# Patient Record
Sex: Male | Born: 1970
Health system: Southern US, Community
[De-identification: ages and names within clinical notes are randomized; demographics above are authoritative.]

## PROBLEM LIST (undated history)

## (undated) DIAGNOSIS — Z833 Family history of diabetes mellitus: Secondary | ICD-10-CM

## (undated) DIAGNOSIS — E162 Hypoglycemia, unspecified: Secondary | ICD-10-CM

## (undated) DIAGNOSIS — F988 Other specified behavioral and emotional disorders with onset usually occurring in childhood and adolescence: Secondary | ICD-10-CM

## (undated) DIAGNOSIS — B356 Tinea cruris: Secondary | ICD-10-CM

## (undated) DIAGNOSIS — L409 Psoriasis, unspecified: Secondary | ICD-10-CM

## (undated) DIAGNOSIS — E7800 Pure hypercholesterolemia, unspecified: Secondary | ICD-10-CM

## (undated) DIAGNOSIS — N529 Male erectile dysfunction, unspecified: Secondary | ICD-10-CM

## (undated) DIAGNOSIS — S39012A Strain of muscle, fascia and tendon of lower back, initial encounter: Secondary | ICD-10-CM

## (undated) DIAGNOSIS — R5381 Other malaise: Secondary | ICD-10-CM

## (undated) DIAGNOSIS — K219 Gastro-esophageal reflux disease without esophagitis: Secondary | ICD-10-CM

## (undated) DIAGNOSIS — I10 Essential (primary) hypertension: Secondary | ICD-10-CM

## (undated) HISTORY — DX: Essential (primary) hypertension: I10

## (undated) HISTORY — DX: Other malaise: R53.81

## (undated) HISTORY — DX: Male erectile dysfunction, unspecified: N52.9

## (undated) HISTORY — DX: Family history of diabetes mellitus: Z83.3

## (undated) HISTORY — DX: Strain of muscle, fascia and tendon of lower back, initial encounter: S39.012A

## (undated) HISTORY — DX: Psoriasis, unspecified: L40.9

## (undated) HISTORY — DX: Gastro-esophageal reflux disease without esophagitis: K21.9

## (undated) HISTORY — DX: Hypoglycemia, unspecified: E16.2

## (undated) HISTORY — DX: Other specified behavioral and emotional disorders with onset usually occurring in childhood and adolescence: F98.8

## (undated) HISTORY — PX: NO PAST SURGERIES: SHX2092

## (undated) HISTORY — DX: Tinea cruris: B35.6

## (undated) HISTORY — DX: Pure hypercholesterolemia, unspecified: E78.00

---

## 2015-07-07 ENCOUNTER — Ambulatory Visit (INDEPENDENT_AMBULATORY_CARE_PROVIDER_SITE_OTHER): Payer: BLUE CROSS/BLUE SHIELD | Admitting: Family Medicine

## 2015-07-07 VITALS — BP 116/70 | HR 82 | Temp 98.4°F | Resp 16 | Ht 70.0 in | Wt 197.6 lb

## 2015-07-07 DIAGNOSIS — S39012A Strain of muscle, fascia and tendon of lower back, initial encounter: Secondary | ICD-10-CM | POA: Diagnosis not present

## 2015-07-07 MED ORDER — MELOXICAM 7.5 MG PO TABS: 7.5000 mg | ORAL_TABLET | Freq: Every day | ORAL | Status: DC

## 2015-07-07 MED ORDER — CYCLOBENZAPRINE HCL 5 MG PO TABS: ORAL_TABLET | ORAL | Status: DC

## 2015-07-07 NOTE — Progress Notes (Addendum)
By signing my name below I, [name], attest that this documentation has been prepared under the direction and in the presence of [name], MD. Electonically Signed. [name], Scribe 07/07/2015 at 9:02 AM  Subjective:    Patient ID: Timothy Swanson, male    DOB: [birth year], 45 y.o.   MRN: [id number]  Chief Complaint  Patient presents with  . Back Pain    Since yesterday     HPI Timothy Swanson is a 45 y.o. male who presents to the [facility] complaining of back pain that has been constant since onset yesterday. Pain started while pt was lifting a heavy bag of mulch. Pain was sudden onset into his low back. Pt denies any radiation of pain, numbness or tingling in legs or groin, weakness, urinary or bowel incontinence. Pt tried his wife's tramadol for pain with minimal relief. Pt denies taking any ibuprofen.    There are no active problems to display for this patient.  Past Medical History  Diagnosis Date  . GERD (gastroesophageal reflux disease)   . Hypertension    No past surgical history on file. Not on File Prior to Admission medications   Medication Sig Start Date End Date Taking? Authorizing Provider  calcium-vitamin D (OSCAL WITH D) 500-200 MG-UNIT tablet Take 1 tablet by mouth.   Yes Historical Provider, MD  lisinopril-hydrochlorothiazide (PRINZIDE,ZESTORETIC) 20-12.5 MG tablet Take 1 tablet by mouth daily.   Yes Historical Provider, MD  omeprazole (PRILOSEC) 20 MG capsule Take 20 mg by mouth daily.   Yes Historical Provider, MD  potassium chloride (K-DUR,KLOR-CON) 10 MEQ tablet Take 10 mEq by mouth 2 (two) times daily.   Yes Historical Provider, MD  traMADol (ULTRAM-ER) 100 MG 24 hr tablet Take 100 mg by mouth daily.   Yes Historical Provider, MD   Social History   Social History  . Marital Status: Married    Spouse Name: N/A  . Number of Children: N/A  . Years of Education: N/A   Occupational History  . Not on file.   Social  History Main Topics  . Smoking status: Never Smoker   . Smokeless tobacco: Not on file  . Alcohol Use: Not on file  . Drug Use: Not on file  . Sexual Activity: Not on file   Other Topics Concern  . Not on file   Social History Narrative  . No narrative on file      Review of Systems  Constitutional: Negative for fever.  Musculoskeletal: Positive for back pain.  Neurological: Negative for numbness.       Objective:   Physical Exam  Constitutional: He is oriented to person, place, and time. He appears well-developed and well-nourished. No distress.  HENT:  Head: Normocephalic and atraumatic.  Eyes: Conjunctivae are normal. Pupils are equal, round, and reactive to light.  Neck: Neck supple.  Cardiovascular: Normal rate.   Pulmonary/Chest: Effort normal.  Musculoskeletal: Normal range of motion.       Lumbar back: He exhibits pain (reproduced with palpation in paralumbar region L>R) and spasm (paralumbar muscles). He exhibits no bony tenderness and no deformity.  Pt has 80 degree flexion of the low back, minimal extension, decrease in lateral flexion (right worse than left), pt has equal rotation of spine. Pt has normal heel and toe walk.   Neurological: He is alert and oriented to person, place, and time. He has normal strength. No sensory deficit. Gait normal. He displays no Babinski's sign on  the right side. He displays no Babinski's sign on the left side.  Reflex Scores:      Patellar reflexes are 2+ on the right side and 2+ on the left side.      Achilles reflexes are 2+ on the right side and 2+ on the left side. Pt has negative seated straight leg raise bilat.  Skin: Skin is warm and dry.  Psychiatric: He has a normal mood and affect. His behavior is normal.  Nursing note and vitals reviewed.    Filed Vitals:   07/07/15 0839  BP: 116/70  Pulse: 82  Temp: 98.4 F (36.9 C)  TempSrc: Oral  Resp: 16  Height:  (1.778 m)  Weight: 197 lb 9.6 oz (89.631 kg)    SpO2: 97%        Assessment & Plan:   Timothy Swanson is a 45 y.o. male Low back strain, initial encounter - Plan: meloxicam (MOBIC) 7.5 MG tablet, cyclobenzaprine (FLEXERIL) 5 MG tablet   -no red flags on hx/exam. Imaging deferred at present.   -mobic, flexeril if needed, side effects discussed. Note for work today, HEP/handout and RTC precautions given.   Meds ordered this encounter  Medications  . meloxicam (MOBIC) 7.5 MG tablet    Sig: Take 1 tablet (7.5 mg total) by mouth daily.    Dispense:  30 tablet    Refill:  0  . cyclobenzaprine (FLEXERIL) 5 MG tablet    Sig: 1 pill by mouth up to every 8 hours as needed. Start with one pill by mouth each bedtime as needed due to sedation    Dispense:  15 tablet    Refill:  0   Patient Instructions       IF you received an x-ray today, you will receive an invoice from [facility]. Please contact [facility] at [phone] with questions or concerns regarding your invoice.   IF you received labwork today, you will receive an invoice from [employer]. Please contact [facility] at [phone] with questions or concerns regarding your invoice.   Our billing staff will not be able to assist you with questions regarding bills from these companies.  You will be contacted with the lab results as soon as they are available. The fastest way to get your results is to activate your My Chart account. Instructions are located on the last page of this paperwork. If you have not heard from [place] regarding the results in 2 weeks, please contact this office.     You likely have a sprained ligament or strained muscle in the low back, which can lead to some muscle spasm as well. Try the mobic each morning (do not combine with other over the counter pain relievers), flexeril at night if needed. Tylenol is ok if needed.   Heat or ice to area as needed and the other treatments and exercises in the back care manual  as tolerated. If not improving in next week or two, or worsening sooner - return for recheck.   Return to the clinic or go to the nearest emergency room if any of your symptoms worsen or new symptoms occur.   Low Back Strain With Rehab A strain is an injury in which a tendon or muscle is torn. The muscles and tendons of the lower back are vulnerable to strains. However, these muscles and tendons are very strong and require a great force to be injured. Strains are classified into three categories. Grade 1 strains cause pain,  but the tendon is not lengthened. Grade 2 strains include a lengthened ligament, due to the ligament being stretched or partially ruptured. With grade 2 strains there is still function, although the function may be decreased. Grade 3 strains involve a complete tear of the tendon or muscle, and function is usually impaired. SYMPTOMS   Pain in the lower back.  Pain that affects one side more than the other.  Pain that gets worse with movement and may be felt in the hip, buttocks, or back of the thigh.  Muscle spasms of the muscles in the back.  Swelling along the muscles of the back.  Loss of strength of the back muscles.  Crackling sound (crepitation) when the muscles are touched. CAUSES  Lower back strains occur when a force is placed on the muscles or tendons that is greater than they can handle. Common causes of injury include:  Prolonged overuse of the muscle-tendon units in the lower back, usually from incorrect posture.  A single violent injury or force applied to the back. RISK INCREASES WITH:  Sports that involve twisting forces on the spine or a lot of bending at the waist (football, rugby, weightlifting, bowling, golf, tennis, speed skating, racquetball, swimming, running, gymnastics, diving).  Poor strength and flexibility.  Failure to warm up properly before activity.  Family history of lower back pain or disk disorders.  Previous back injury or  surgery (especially fusion).  Poor posture with lifting, especially heavy objects.  Prolonged sitting, especially with poor posture. PREVENTION   Learn and use proper posture when sitting or lifting (maintain proper posture when sitting, lift using the knees and legs, not at the waist).  Warm up and stretch properly before activity.  Allow for adequate recovery between workouts.  Maintain physical fitness:  Strength, flexibility, and endurance.  Cardiovascular fitness. PROGNOSIS  If treated properly, lower back strains usually heal within 6 weeks. RELATED COMPLICATIONS   Recurring symptoms, resulting in a chronic problem.  Chronic inflammation, scarring, and partial muscle-tendon tear.  Delayed healing or resolution of symptoms.  Prolonged disability. TREATMENT  Treatment first involves the use of ice and medicine, to reduce pain and inflammation. The use of strengthening and stretching exercises may help reduce pain with activity. These exercises may be performed at home or with a therapist. Severe injuries may require referral to a therapist for further evaluation and treatment, such as ultrasound. Your caregiver may advise that you wear a back brace or corset, to help reduce pain and discomfort. Often, prolonged bed rest results in greater harm then benefit. Corticosteroid injections may be recommended. However, these should be reserved for the most serious cases. It is important to avoid using your back when lifting objects. At night, sleep on your back on a firm mattress with a pillow placed under your knees. If non-surgical treatment is unsuccessful, surgery may be needed.  MEDICATION   If pain medicine is needed, nonsteroidal anti-inflammatory medicines (aspirin and ibuprofen), or other minor pain relievers (acetaminophen), are often advised.  Do not take pain medicine for 7 days before surgery.  Prescription pain relievers may be given, if your caregiver thinks they are  needed. Use only as directed and only as much as you need.  Ointments applied to the skin may be helpful.  Corticosteroid injections may be given by your caregiver. These injections should be reserved for the most serious cases, because they may only be given a certain number of times. HEAT AND COLD  Cold treatment (icing) should be  applied for 10 to 15 minutes every 2 to 3 hours for inflammation and pain, and immediately after activity that aggravates your symptoms. Use ice packs or an ice massage.  Heat treatment may be used before performing stretching and strengthening activities prescribed by your caregiver, physical therapist, or athletic trainer. Use a heat pack or a warm water soak. SEEK MEDICAL CARE IF:   Symptoms get worse or do not improve in 2 to 4 weeks, despite treatment.  You develop numbness, weakness, or loss of bowel or bladder function.  New, unexplained symptoms develop. (Drugs used in treatment may produce side effects.) EXERCISES  RANGE OF MOTION (ROM) AND STRETCHING EXERCISES - Low Back Strain Most people with lower back pain will find that their symptoms get worse with excessive bending forward (flexion) or arching at the lower back (extension). The exercises which will help resolve your symptoms will focus on the opposite motion.  Your physician, physical therapist or athletic trainer will help you determine which exercises will be most helpful to resolve your lower back pain. Do not complete any exercises without first consulting with your caregiver. Discontinue any exercises which make your symptoms worse until you speak to your caregiver.  If you have pain, numbness or tingling which travels down into your buttocks, leg or foot, the goal of the therapy is for these symptoms to move closer to your back and eventually resolve. Sometimes, these leg symptoms will get better, but your lower back pain may worsen. This is typically an indication of progress in your  rehabilitation. Be very alert to any changes in your symptoms and the activities in which you participated in the 24 hours prior to the change. Sharing this information with your caregiver will allow him/her to most efficiently treat your condition.  These exercises may help you when beginning to rehabilitate your injury. Your symptoms may resolve with or without further involvement from your physician, physical therapist or athletic trainer. While completing these exercises, remember:  Restoring tissue flexibility helps normal motion to return to the joints. This allows healthier, less painful movement and activity.  An effective stretch should be held for at least 30 seconds.  A stretch should never be painful. You should only feel a gentle lengthening or release in the stretched tissue. FLEXION RANGE OF MOTION AND STRETCHING EXERCISES: STRETCH - Flexion, Single Knee to Chest   Lie on a firm bed or floor with both legs extended in front of you.  Keeping one leg in contact with the floor, bring your opposite knee to your chest. Hold your leg in place by either grabbing behind your thigh or at your knee.  Pull until you feel a gentle stretch in your lower back. Hold __________ seconds.  Slowly release your grasp and repeat the exercise with the opposite side. Repeat __________ times. Complete this exercise __________ times per day.  STRETCH - Flexion, Double Knee to Chest   Lie on a firm bed or floor with both legs extended in front of you.  Keeping one leg in contact with the floor, bring your opposite knee to your chest.  Tense your stomach muscles to support your back and then lift your other knee to your chest. Hold your legs in place by either grabbing behind your thighs or at your knees.  Pull both knees toward your chest until you feel a gentle stretch in your lower back. Hold __________ seconds.  Tense your stomach muscles and slowly return one leg at a time to the  floor. Repeat __________ times. Complete this exercise __________ times per day.  STRETCH - Low Trunk Rotation  Lie on a firm bed or floor. Keeping your legs in front of you, bend your knees so they are both pointed toward the ceiling and your feet are flat on the floor.  Extend your arms out to the side. This will stabilize your upper body by keeping your shoulders in contact with the floor.  Gently and slowly drop both knees together to one side until you feel a gentle stretch in your lower back. Hold for __________ seconds.  Tense your stomach muscles to support your lower back as you bring your knees back to the starting position. Repeat the exercise to the other side. Repeat __________ times. Complete this exercise __________ times per day  EXTENSION RANGE OF MOTION AND FLEXIBILITY EXERCISES: STRETCH - Extension, Prone on Elbows   Lie on your stomach on the floor, a bed will be too soft. Place your palms about shoulder width apart and at the height of your head.  Place your elbows under your shoulders. If this is too painful, stack pillows under your chest.  Allow your body to relax so that your hips drop lower and make contact more completely with the floor.  Hold this position for __________ seconds.  Slowly return to lying flat on the floor. Repeat __________ times. Complete this exercise __________ times per day.  RANGE OF MOTION - Extension, Prone Press Ups  Lie on your stomach on the floor, a bed will be too soft. Place your palms about shoulder width apart and at the height of your head.  Keeping your back as relaxed as possible, slowly straighten your elbows while keeping your hips on the floor. You may adjust the placement of your hands to maximize your comfort. As you gain motion, your hands will come more underneath your shoulders.  Hold this position __________ seconds.  Slowly return to lying flat on the floor. Repeat __________ times. Complete this exercise  __________ times per day.  RANGE OF MOTION- Quadruped, Neutral Spine   Assume a hands and knees position on a firm surface. Keep your hands under your shoulders and your knees under your hips. You may place padding under your knees for comfort.  Drop your head and point your tail bone toward the ground below you. This will round out your lower back like an angry cat. Hold this position for __________ seconds.  Slowly lift your head and release your tail bone so that your back sags into a large arch, like an old horse.  Hold this position for __________ seconds.  Repeat this until you feel limber in your lower back.  Now, find your "sweet spot." This will be the most comfortable position somewhere between the two previous positions. This is your neutral spine. Once you have found this position, tense your stomach muscles to support your lower back.  Hold this position for __________ seconds. Repeat __________ times. Complete this exercise __________ times per day.  STRENGTHENING EXERCISES - Low Back Strain These exercises may help you when beginning to rehabilitate your injury. These exercises should be done near your "sweet spot." This is the neutral, low-back arch, somewhere between fully rounded and fully arched, that is your least painful position. When performed in this safe range of motion, these exercises can be used for people who have either a flexion or extension based injury. These exercises may resolve your symptoms with or without further involvement from your physician,  physical therapist or athletic trainer. While completing these exercises, remember:   Muscles can gain both the endurance and the strength needed for everyday activities through controlled exercises.  Complete these exercises as instructed by your physician, physical therapist or athletic trainer. Increase the resistance and repetitions only as guided.  You may experience muscle soreness or fatigue, but the pain  or discomfort you are trying to eliminate should never worsen during these exercises. If this pain does worsen, stop and make certain you are following the directions exactly. If the pain is still present after adjustments, discontinue the exercise until you can discuss the trouble with your caregiver. STRENGTHENING - Deep Abdominals, Pelvic Tilt  Lie on a firm bed or floor. Keeping your legs in front of you, bend your knees so they are both pointed toward the ceiling and your feet are flat on the floor.  Tense your lower abdominal muscles to press your lower back into the floor. This motion will rotate your pelvis so that your tail bone is scooping upwards rather than pointing at your feet or into the floor.  With a gentle tension and even breathing, hold this position for __________ seconds. Repeat __________ times. Complete this exercise __________ times per day.  STRENGTHENING - Abdominals, Crunches   Lie on a firm bed or floor. Keeping your legs in front of you, bend your knees so they are both pointed toward the ceiling and your feet are flat on the floor. Cross your arms over your chest.  Slightly tip your chin down without bending your neck.  Tense your abdominals and slowly lift your trunk high enough to just clear your shoulder blades. Lifting higher can put excessive stress on the lower back and does not further strengthen your abdominal muscles.  Control your return to the starting position. Repeat __________ times. Complete this exercise __________ times per day.  STRENGTHENING - Quadruped, Opposite UE/LE Lift   Assume a hands and knees position on a firm surface. Keep your hands under your shoulders and your knees under your hips. You may place padding under your knees for comfort.  Find your neutral spine and gently tense your abdominal muscles so that you can maintain this position. Your shoulders and hips should form a rectangle that is parallel with the floor and is not  twisted.  Keeping your trunk steady, lift your right hand no higher than your shoulder and then your left leg no higher than your hip. Make sure you are not holding your breath. Hold this position __________ seconds.  Continuing to keep your abdominal muscles tense and your back steady, slowly return to your starting position. Repeat with the opposite arm and leg. Repeat __________ times. Complete this exercise __________ times per day.  STRENGTHENING - Lower Abdominals, Double Knee Lift  Lie on a firm bed or floor. Keeping your legs in front of you, bend your knees so they are both pointed toward the ceiling and your feet are flat on the floor.  Tense your abdominal muscles to brace your lower back and slowly lift both of your knees until they come over your hips. Be certain not to hold your breath.  Hold __________ seconds. Using your abdominal muscles, return to the starting position in a slow and controlled manner. Repeat __________ times. Complete this exercise __________ times per day.  POSTURE AND BODY MECHANICS CONSIDERATIONS - Low Back Strain Keeping correct posture when sitting, standing or completing your activities will reduce the stress put on different body tissues,  allowing injured tissues a chance to heal and limiting painful experiences. The following are general guidelines for improved posture. Your physician or physical therapist will provide you with any instructions specific to your needs. While reading these guidelines, remember:  The exercises prescribed by your provider will help you have the flexibility and strength to maintain correct postures.  The correct posture provides the best environment for your joints to work. All of your joints have less wear and tear when properly supported by a spine with good posture. This means you will experience a healthier, less painful body.  Correct posture must be practiced with all of your activities, especially prolonged sitting  and standing. Correct posture is as important when doing repetitive low-stress activities (typing) as it is when doing a single heavy-load activity (lifting). RESTING POSITIONS Consider which positions are most painful for you when choosing a resting position. If you have pain with flexion-based activities (sitting, bending, stooping, squatting), choose a position that allows you to rest in a less flexed posture. You would want to avoid curling into a fetal position on your side. If your pain worsens with extension-based activities (prolonged standing, working overhead), avoid resting in an extended position such as sleeping on your stomach. Most people will find more comfort when they rest with their spine in a more neutral position, neither too rounded nor too arched. Lying on a non-sagging bed on your side with a pillow between your knees, or on your back with a pillow under your knees will often provide some relief. Keep in mind, being in any one position for a prolonged period of time, no matter how correct your posture, can still lead to stiffness. PROPER SITTING POSTURE In order to minimize stress and discomfort on your spine, you must sit with correct posture. Sitting with good posture should be effortless for a healthy body. Returning to good posture is a gradual process. Many people can work toward this most comfortably by using various supports until they have the flexibility and strength to maintain this posture on their own. When sitting with proper posture, your ears will fall over your shoulders and your shoulders will fall over your hips. You should use the back of the chair to support your upper back. Your lower back will be in a neutral position, just slightly arched. You may place a small pillow or folded towel at the base of your lower back for support.  When working at a desk, create an environment that supports good, upright posture. Without extra support, muscles tire, which leads to  excessive strain on joints and other tissues. Keep these recommendations in mind: CHAIR:  A chair should be able to slide under your desk when your back makes contact with the back of the chair. This allows you to work closely.  The chair's height should allow your eyes to be level with the upper part of your monitor and your hands to be slightly lower than your elbows. BODY POSITION  Your feet should make contact with the floor. If this is not possible, use a foot rest.  Keep your ears over your shoulders. This will reduce stress on your neck and lower back. INCORRECT SITTING POSTURES  If you are feeling tired and unable to assume a healthy sitting posture, do not slouch or slump. This puts excessive strain on your back tissues, causing more damage and pain. Healthier options include:  Using more support, like a lumbar pillow.  Switching tasks to something that requires you to  be upright or walking.  Talking a brief walk.  Lying down to rest in a neutral-spine position. PROLONGED STANDING WHILE SLIGHTLY LEANING FORWARD  When completing a task that requires you to lean forward while standing in one place for a long time, place either foot up on a stationary 2-4 inch high object to help maintain the best posture. When both feet are on the ground, the lower back tends to lose its slight inward curve. If this curve flattens (or becomes too large), then the back and your other joints will experience too much stress, tire more quickly, and can cause pain. CORRECT STANDING POSTURES Proper standing posture should be assumed with all daily activities, even if they only take a few moments, like when brushing your teeth. As in sitting, your ears should fall over your shoulders and your shoulders should fall over your hips. You should keep a slight tension in your abdominal muscles to brace your spine. Your tailbone should point down to the ground, not behind your body, resulting in an over-extended  swayback posture.  INCORRECT STANDING POSTURES  Common incorrect standing postures include a forward head, locked knees and/or an excessive swayback. WALKING Walk with an upright posture. Your ears, shoulders and hips should all line-up. PROLONGED ACTIVITY IN A FLEXED POSITION When completing a task that requires you to bend forward at your waist or lean over a low surface, try to find a way to stabilize 3 out of 4 of your limbs. You can place a hand or elbow on your thigh or rest a knee on the surface you are reaching across. This will provide you more stability so that your muscles do not fatigue as quickly. By keeping your knees relaxed, or slightly bent, you will also reduce stress across your lower back. CORRECT LIFTING TECHNIQUES DO :   Assume a wide stance. This will provide you more stability and the opportunity to get as close as possible to the object which you are lifting.  Tense your abdominals to brace your spine. Bend at the knees and hips. Keeping your back locked in a neutral-spine position, lift using your leg muscles. Lift with your legs, keeping your back straight.  Test the weight of unknown objects before attempting to lift them.  Try to keep your elbows locked down at your sides in order get the best strength from your shoulders when carrying an object.  Always ask for help when lifting heavy or awkward objects. INCORRECT LIFTING TECHNIQUES DO NOT:   Lock your knees when lifting, even if it is a small object.  Bend and twist. Pivot at your feet or move your feet when needing to change directions.  Assume that you can safely pick up even a paper clip without proper posture.   This information is not intended to replace advice given to you by your health care provider. Make sure you discuss any questions you have with your health care provider.   Document Released: 01/24/2005 Document Revised: 02/14/2014 Document Reviewed: 05/08/2008 [employer] Interactive Patient  Education [employer]       I personally performed the services described in this documentation, which was scribed in my presence. The recorded information has been reviewed and considered, and addended by me as needed.  Signed,   Meredith Staggers, MD [facility].  07/07/2015 12:20 PM

## 2015-07-07 NOTE — Patient Instructions (Addendum)
IF you received an x-ray today, you will receive an invoice from [facility]. Please contact [facility] at [phone] with questions or concerns regarding your invoice.   IF you received labwork today, you will receive an invoice from [employer]. Please contact [facility] at [phone] with questions or concerns regarding your invoice.   Our billing staff will not be able to assist you with questions regarding bills from these companies.  You will be contacted with the lab results as soon as they are available. The fastest way to get your results is to activate your My Chart account. Instructions are located on the last page of this paperwork. If you have not heard from [place] regarding the results in 2 weeks, please contact this office.     You likely have a sprained ligament or strained muscle in the low back, which can lead to some muscle spasm as well. Try the mobic each morning (do not combine with other over the counter pain relievers), flexeril at night if needed. Tylenol is ok if needed.   Heat or ice to area as needed and the other treatments and exercises in the back care manual as tolerated. If not improving in next week or two, or worsening sooner - return for recheck.   Return to the clinic or go to the nearest emergency room if any of your symptoms worsen or new symptoms occur.   Low Back Strain With Rehab A strain is an injury in which a tendon or muscle is torn. The muscles and tendons of the lower back are vulnerable to strains. However, these muscles and tendons are very strong and require a great force to be injured. Strains are classified into three categories. Grade 1 strains cause pain, but the tendon is not lengthened. Grade 2 strains include a lengthened ligament, due to the ligament being stretched or partially ruptured. With grade 2 strains there is still function, although the function may be decreased. Grade 3 strains  involve a complete tear of the tendon or muscle, and function is usually impaired. SYMPTOMS   Pain in the lower back.  Pain that affects one side more than the other.  Pain that gets worse with movement and may be felt in the hip, buttocks, or back of the thigh.  Muscle spasms of the muscles in the back.  Swelling along the muscles of the back.  Loss of strength of the back muscles.  Crackling sound (crepitation) when the muscles are touched. CAUSES  Lower back strains occur when a force is placed on the muscles or tendons that is greater than they can handle. Common causes of injury include:  Prolonged overuse of the muscle-tendon units in the lower back, usually from incorrect posture.  A single violent injury or force applied to the back. RISK INCREASES WITH:  Sports that involve twisting forces on the spine or a lot of bending at the waist (football, rugby, weightlifting, bowling, golf, tennis, speed skating, racquetball, swimming, running, gymnastics, diving).  Poor strength and flexibility.  Failure to warm up properly before activity.  Family history of lower back pain or disk disorders.  Previous back injury or surgery (especially fusion).  Poor posture with lifting, especially heavy objects.  Prolonged sitting, especially with poor posture. PREVENTION   Learn and use proper posture when sitting or lifting (maintain proper posture when sitting, lift using the knees and legs, not at the waist).  Warm up and stretch properly before activity.  Allow  for adequate recovery between workouts.  Maintain physical fitness:  Strength, flexibility, and endurance.  Cardiovascular fitness. PROGNOSIS  If treated properly, lower back strains usually heal within 6 weeks. RELATED COMPLICATIONS   Recurring symptoms, resulting in a chronic problem.  Chronic inflammation, scarring, and partial muscle-tendon tear.  Delayed healing or resolution of symptoms.  Prolonged  disability. TREATMENT  Treatment first involves the use of ice and medicine, to reduce pain and inflammation. The use of strengthening and stretching exercises may help reduce pain with activity. These exercises may be performed at home or with a therapist. Severe injuries may require referral to a therapist for further evaluation and treatment, such as ultrasound. Your caregiver may advise that you wear a back brace or corset, to help reduce pain and discomfort. Often, prolonged bed rest results in greater harm then benefit. Corticosteroid injections may be recommended. However, these should be reserved for the most serious cases. It is important to avoid using your back when lifting objects. At night, sleep on your back on a firm mattress with a pillow placed under your knees. If non-surgical treatment is unsuccessful, surgery may be needed.  MEDICATION   If pain medicine is needed, nonsteroidal anti-inflammatory medicines (aspirin and ibuprofen), or other minor pain relievers (acetaminophen), are often advised.  Do not take pain medicine for 7 days before surgery.  Prescription pain relievers may be given, if your caregiver thinks they are needed. Use only as directed and only as much as you need.  Ointments applied to the skin may be helpful.  Corticosteroid injections may be given by your caregiver. These injections should be reserved for the most serious cases, because they may only be given a certain number of times. HEAT AND COLD  Cold treatment (icing) should be applied for 10 to 15 minutes every 2 to 3 hours for inflammation and pain, and immediately after activity that aggravates your symptoms. Use ice packs or an ice massage.  Heat treatment may be used before performing stretching and strengthening activities prescribed by your caregiver, physical therapist, or athletic trainer. Use a heat pack or a warm water soak. SEEK MEDICAL CARE IF:   Symptoms get worse or do not improve in 2  to 4 weeks, despite treatment.  You develop numbness, weakness, or loss of bowel or bladder function.  New, unexplained symptoms develop. (Drugs used in treatment may produce side effects.) EXERCISES  RANGE OF MOTION (ROM) AND STRETCHING EXERCISES - Low Back Strain Most people with lower back pain will find that their symptoms get worse with excessive bending forward (flexion) or arching at the lower back (extension). The exercises which will help resolve your symptoms will focus on the opposite motion.  Your physician, physical therapist or athletic trainer will help you determine which exercises will be most helpful to resolve your lower back pain. Do not complete any exercises without first consulting with your caregiver. Discontinue any exercises which make your symptoms worse until you speak to your caregiver.  If you have pain, numbness or tingling which travels down into your buttocks, leg or foot, the goal of the therapy is for these symptoms to move closer to your back and eventually resolve. Sometimes, these leg symptoms will get better, but your lower back pain may worsen. This is typically an indication of progress in your rehabilitation. Be very alert to any changes in your symptoms and the activities in which you participated in the 24 hours prior to the change. Sharing this information with your caregiver  will allow him/her to most efficiently treat your condition.  These exercises may help you when beginning to rehabilitate your injury. Your symptoms may resolve with or without further involvement from your physician, physical therapist or athletic trainer. While completing these exercises, remember:  Restoring tissue flexibility helps normal motion to return to the joints. This allows healthier, less painful movement and activity.  An effective stretch should be held for at least 30 seconds.  A stretch should never be painful. You should only feel a gentle lengthening or release in  the stretched tissue. FLEXION RANGE OF MOTION AND STRETCHING EXERCISES: STRETCH - Flexion, Single Knee to Chest   Lie on a firm bed or floor with both legs extended in front of you.  Keeping one leg in contact with the floor, bring your opposite knee to your chest. Hold your leg in place by either grabbing behind your thigh or at your knee.  Pull until you feel a gentle stretch in your lower back. Hold __________ seconds.  Slowly release your grasp and repeat the exercise with the opposite side. Repeat __________ times. Complete this exercise __________ times per day.  STRETCH - Flexion, Double Knee to Chest   Lie on a firm bed or floor with both legs extended in front of you.  Keeping one leg in contact with the floor, bring your opposite knee to your chest.  Tense your stomach muscles to support your back and then lift your other knee to your chest. Hold your legs in place by either grabbing behind your thighs or at your knees.  Pull both knees toward your chest until you feel a gentle stretch in your lower back. Hold __________ seconds.  Tense your stomach muscles and slowly return one leg at a time to the floor. Repeat __________ times. Complete this exercise __________ times per day.  STRETCH - Low Trunk Rotation  Lie on a firm bed or floor. Keeping your legs in front of you, bend your knees so they are both pointed toward the ceiling and your feet are flat on the floor.  Extend your arms out to the side. This will stabilize your upper body by keeping your shoulders in contact with the floor.  Gently and slowly drop both knees together to one side until you feel a gentle stretch in your lower back. Hold for __________ seconds.  Tense your stomach muscles to support your lower back as you bring your knees back to the starting position. Repeat the exercise to the other side. Repeat __________ times. Complete this exercise __________ times per day  EXTENSION RANGE OF MOTION AND  FLEXIBILITY EXERCISES: STRETCH - Extension, Prone on Elbows   Lie on your stomach on the floor, a bed will be too soft. Place your palms about shoulder width apart and at the height of your head.  Place your elbows under your shoulders. If this is too painful, stack pillows under your chest.  Allow your body to relax so that your hips drop lower and make contact more completely with the floor.  Hold this position for __________ seconds.  Slowly return to lying flat on the floor. Repeat __________ times. Complete this exercise __________ times per day.  RANGE OF MOTION - Extension, Prone Press Ups  Lie on your stomach on the floor, a bed will be too soft. Place your palms about shoulder width apart and at the height of your head.  Keeping your back as relaxed as possible, slowly straighten your elbows while keeping your  hips on the floor. You may adjust the placement of your hands to maximize your comfort. As you gain motion, your hands will come more underneath your shoulders.  Hold this position __________ seconds.  Slowly return to lying flat on the floor. Repeat __________ times. Complete this exercise __________ times per day.  RANGE OF MOTION- Quadruped, Neutral Spine   Assume a hands and knees position on a firm surface. Keep your hands under your shoulders and your knees under your hips. You may place padding under your knees for comfort.  Drop your head and point your tail bone toward the ground below you. This will round out your lower back like an angry cat. Hold this position for __________ seconds.  Slowly lift your head and release your tail bone so that your back sags into a large arch, like an old horse.  Hold this position for __________ seconds.  Repeat this until you feel limber in your lower back.  Now, find your "sweet spot." This will be the most comfortable position somewhere between the two previous positions. This is your neutral spine. Once you have found  this position, tense your stomach muscles to support your lower back.  Hold this position for __________ seconds. Repeat __________ times. Complete this exercise __________ times per day.  STRENGTHENING EXERCISES - Low Back Strain These exercises may help you when beginning to rehabilitate your injury. These exercises should be done near your "sweet spot." This is the neutral, low-back arch, somewhere between fully rounded and fully arched, that is your least painful position. When performed in this safe range of motion, these exercises can be used for people who have either a flexion or extension based injury. These exercises may resolve your symptoms with or without further involvement from your physician, physical therapist or athletic trainer. While completing these exercises, remember:   Muscles can gain both the endurance and the strength needed for everyday activities through controlled exercises.  Complete these exercises as instructed by your physician, physical therapist or athletic trainer. Increase the resistance and repetitions only as guided.  You may experience muscle soreness or fatigue, but the pain or discomfort you are trying to eliminate should never worsen during these exercises. If this pain does worsen, stop and make certain you are following the directions exactly. If the pain is still present after adjustments, discontinue the exercise until you can discuss the trouble with your caregiver. STRENGTHENING - Deep Abdominals, Pelvic Tilt  Lie on a firm bed or floor. Keeping your legs in front of you, bend your knees so they are both pointed toward the ceiling and your feet are flat on the floor.  Tense your lower abdominal muscles to press your lower back into the floor. This motion will rotate your pelvis so that your tail bone is scooping upwards rather than pointing at your feet or into the floor.  With a gentle tension and even breathing, hold this position for __________  seconds. Repeat __________ times. Complete this exercise __________ times per day.  STRENGTHENING - Abdominals, Crunches   Lie on a firm bed or floor. Keeping your legs in front of you, bend your knees so they are both pointed toward the ceiling and your feet are flat on the floor. Cross your arms over your chest.  Slightly tip your chin down without bending your neck.  Tense your abdominals and slowly lift your trunk high enough to just clear your shoulder blades. Lifting higher can put excessive stress on the lower  back and does not further strengthen your abdominal muscles.  Control your return to the starting position. Repeat __________ times. Complete this exercise __________ times per day.  STRENGTHENING - Quadruped, Opposite UE/LE Lift   Assume a hands and knees position on a firm surface. Keep your hands under your shoulders and your knees under your hips. You may place padding under your knees for comfort.  Find your neutral spine and gently tense your abdominal muscles so that you can maintain this position. Your shoulders and hips should form a rectangle that is parallel with the floor and is not twisted.  Keeping your trunk steady, lift your right hand no higher than your shoulder and then your left leg no higher than your hip. Make sure you are not holding your breath. Hold this position __________ seconds.  Continuing to keep your abdominal muscles tense and your back steady, slowly return to your starting position. Repeat with the opposite arm and leg. Repeat __________ times. Complete this exercise __________ times per day.  STRENGTHENING - Lower Abdominals, Double Knee Lift  Lie on a firm bed or floor. Keeping your legs in front of you, bend your knees so they are both pointed toward the ceiling and your feet are flat on the floor.  Tense your abdominal muscles to brace your lower back and slowly lift both of your knees until they come over your hips. Be certain not to hold  your breath.  Hold __________ seconds. Using your abdominal muscles, return to the starting position in a slow and controlled manner. Repeat __________ times. Complete this exercise __________ times per day.  POSTURE AND BODY MECHANICS CONSIDERATIONS - Low Back Strain Keeping correct posture when sitting, standing or completing your activities will reduce the stress put on different body tissues, allowing injured tissues a chance to heal and limiting painful experiences. The following are general guidelines for improved posture. Your physician or physical therapist will provide you with any instructions specific to your needs. While reading these guidelines, remember:  The exercises prescribed by your provider will help you have the flexibility and strength to maintain correct postures.  The correct posture provides the best environment for your joints to work. All of your joints have less wear and tear when properly supported by a spine with good posture. This means you will experience a healthier, less painful body.  Correct posture must be practiced with all of your activities, especially prolonged sitting and standing. Correct posture is as important when doing repetitive low-stress activities (typing) as it is when doing a single heavy-load activity (lifting). RESTING POSITIONS Consider which positions are most painful for you when choosing a resting position. If you have pain with flexion-based activities (sitting, bending, stooping, squatting), choose a position that allows you to rest in a less flexed posture. You would want to avoid curling into a fetal position on your side. If your pain worsens with extension-based activities (prolonged standing, working overhead), avoid resting in an extended position such as sleeping on your stomach. Most people will find more comfort when they rest with their spine in a more neutral position, neither too rounded nor too arched. Lying on a non-sagging bed  on your side with a pillow between your knees, or on your back with a pillow under your knees will often provide some relief. Keep in mind, being in any one position for a prolonged period of time, no matter how correct your posture, can still lead to stiffness. PROPER SITTING POSTURE In order  to minimize stress and discomfort on your spine, you must sit with correct posture. Sitting with good posture should be effortless for a healthy body. Returning to good posture is a gradual process. Many people can work toward this most comfortably by using various supports until they have the flexibility and strength to maintain this posture on their own. When sitting with proper posture, your ears will fall over your shoulders and your shoulders will fall over your hips. You should use the back of the chair to support your upper back. Your lower back will be in a neutral position, just slightly arched. You may place a small pillow or folded towel at the base of your lower back for support.  When working at a desk, create an environment that supports good, upright posture. Without extra support, muscles tire, which leads to excessive strain on joints and other tissues. Keep these recommendations in mind: CHAIR:  A chair should be able to slide under your desk when your back makes contact with the back of the chair. This allows you to work closely.  The chair's height should allow your eyes to be level with the upper part of your monitor and your hands to be slightly lower than your elbows. BODY POSITION  Your feet should make contact with the floor. If this is not possible, use a foot rest.  Keep your ears over your shoulders. This will reduce stress on your neck and lower back. INCORRECT SITTING POSTURES  If you are feeling tired and unable to assume a healthy sitting posture, do not slouch or slump. This puts excessive strain on your back tissues, causing more damage and pain. Healthier options  include:  Using more support, like a lumbar pillow.  Switching tasks to something that requires you to be upright or walking.  Talking a brief walk.  Lying down to rest in a neutral-spine position. PROLONGED STANDING WHILE SLIGHTLY LEANING FORWARD  When completing a task that requires you to lean forward while standing in one place for a long time, place either foot up on a stationary 2-4 inch high object to help maintain the best posture. When both feet are on the ground, the lower back tends to lose its slight inward curve. If this curve flattens (or becomes too large), then the back and your other joints will experience too much stress, tire more quickly, and can cause pain. CORRECT STANDING POSTURES Proper standing posture should be assumed with all daily activities, even if they only take a few moments, like when brushing your teeth. As in sitting, your ears should fall over your shoulders and your shoulders should fall over your hips. You should keep a slight tension in your abdominal muscles to brace your spine. Your tailbone should point down to the ground, not behind your body, resulting in an over-extended swayback posture.  INCORRECT STANDING POSTURES  Common incorrect standing postures include a forward head, locked knees and/or an excessive swayback. WALKING Walk with an upright posture. Your ears, shoulders and hips should all line-up. PROLONGED ACTIVITY IN A FLEXED POSITION When completing a task that requires you to bend forward at your waist or lean over a low surface, try to find a way to stabilize 3 out of 4 of your limbs. You can place a hand or elbow on your thigh or rest a knee on the surface you are reaching across. This will provide you more stability so that your muscles do not fatigue as quickly. By keeping your knees relaxed,  or slightly bent, you will also reduce stress across your lower back. CORRECT LIFTING TECHNIQUES DO :   Assume a wide stance. This will provide  you more stability and the opportunity to get as close as possible to the object which you are lifting.  Tense your abdominals to brace your spine. Bend at the knees and hips. Keeping your back locked in a neutral-spine position, lift using your leg muscles. Lift with your legs, keeping your back straight.  Test the weight of unknown objects before attempting to lift them.  Try to keep your elbows locked down at your sides in order get the best strength from your shoulders when carrying an object.  Always ask for help when lifting heavy or awkward objects. INCORRECT LIFTING TECHNIQUES DO NOT:   Lock your knees when lifting, even if it is a small object.  Bend and twist. Pivot at your feet or move your feet when needing to change directions.  Assume that you can safely pick up even a paper clip without proper posture.   This information is not intended to replace advice given to you by your health care provider. Make sure you discuss any questions you have with your health care provider.   Document Released: 01/24/2005 Document Revised: 02/14/2014 Document Reviewed: 05/08/2008 Elsevier Interactive Patient Education [employer]

## 2017-03-30 DIAGNOSIS — K219 Gastro-esophageal reflux disease without esophagitis: Secondary | ICD-10-CM | POA: Diagnosis not present

## 2017-03-30 DIAGNOSIS — I10 Essential (primary) hypertension: Secondary | ICD-10-CM | POA: Diagnosis not present

## 2017-04-07 DIAGNOSIS — Z136 Encounter for screening for cardiovascular disorders: Secondary | ICD-10-CM | POA: Diagnosis not present

## 2017-05-23 DIAGNOSIS — R197 Diarrhea, unspecified: Secondary | ICD-10-CM | POA: Diagnosis not present

## 2017-06-12 DIAGNOSIS — K219 Gastro-esophageal reflux disease without esophagitis: Secondary | ICD-10-CM | POA: Diagnosis not present

## 2017-06-12 DIAGNOSIS — I10 Essential (primary) hypertension: Secondary | ICD-10-CM | POA: Diagnosis not present

## 2017-12-25 DIAGNOSIS — I10 Essential (primary) hypertension: Secondary | ICD-10-CM | POA: Diagnosis not present

## 2017-12-25 DIAGNOSIS — K219 Gastro-esophageal reflux disease without esophagitis: Secondary | ICD-10-CM | POA: Diagnosis not present

## 2017-12-25 DIAGNOSIS — N529 Male erectile dysfunction, unspecified: Secondary | ICD-10-CM | POA: Diagnosis not present

## 2019-04-18 ENCOUNTER — Ambulatory Visit: Payer: 59 | Attending: Internal Medicine

## 2019-04-18 DIAGNOSIS — Z23 Encounter for immunization: Secondary | ICD-10-CM

## 2019-04-18 NOTE — Progress Notes (Signed)
   Covid-19 Vaccination Clinic  Name:  Timothy Swanson    MRN: [id number] DOB: [birth year]  04/18/2019  Timothy Swanson was observed post Covid-19 immunization for 15 minutes without incident. He was provided with Vaccine Information Sheet and instruction to access the V-Safe system.   Timothy Swanson was instructed to call 911 with any severe reactions post vaccine: Marland Kitchen Difficulty breathing  . Swelling of face and throat  . A fast heartbeat  . A bad rash all over body  . Dizziness and weakness   Immunizations Administered    Name Date Dose VIS Date Route   Pfizer COVID-19 Vaccine 04/18/2019  9:09 AM 0.3 mL 01/18/2019 Intramuscular   Manufacturer: [employer], [employer]   Lot: [id number]   NDC: [address]-2

## 2019-05-13 ENCOUNTER — Ambulatory Visit: Payer: 59 | Attending: Internal Medicine

## 2019-05-13 DIAGNOSIS — Z23 Encounter for immunization: Secondary | ICD-10-CM

## 2019-05-13 NOTE — Progress Notes (Signed)
   Covid-19 Vaccination Clinic  Name:  PANKAJ HAACK    MRN: [id number] DOB: 1970-12-07  05/13/2019  Mr. Lange was observed post Covid-19 immunization for 15 minutes without incident. He was provided with Vaccine Information Sheet and instruction to access the V-Safe system.   Mr. Fults was instructed to call 911 with any severe reactions post vaccine: Marland Kitchen Difficulty breathing  . Swelling of face and throat  . A fast heartbeat  . A bad rash all over body  . Dizziness and weakness   Immunizations Administered    Name Date Dose VIS Date Route   Pfizer COVID-19 Vaccine 05/13/2019  9:42 AM 0.3 mL 01/18/2019 Intramuscular   Manufacturer: [employer], [employer]   Lot: [id number]   NDC: [address]-5

## 2020-01-07 ENCOUNTER — Ambulatory Visit: Payer: 59 | Attending: Internal Medicine

## 2020-01-07 DIAGNOSIS — Z23 Encounter for immunization: Secondary | ICD-10-CM

## 2020-01-07 NOTE — Progress Notes (Signed)
   Covid-19 Vaccination Clinic  Name:  Timothy Swanson    MRN: [id number] DOB: 12-May-1970  01/07/2020  Mr. [name] was observed post Covid-19 immunization for 15 minutes without incident. He was provided with Vaccine Information Sheet and instruction to access the V-Safe system.   Mr. [name] was instructed to call 911 with any severe reactions post vaccine: Marland Kitchen Difficulty breathing  . Swelling of face and throat  . A fast heartbeat  . A bad rash all over body  . Dizziness and weakness   Immunizations Administered    Name Date Dose VIS Date Route   Pfizer COVID-19 Vaccine 01/07/2020  2:09 PM 0.3 mL 11/27/2019 Intramuscular   Manufacturer: [employer], [employer]   Lot: [id number]   NDC: [address]-3

## 2020-01-28 ENCOUNTER — Other Ambulatory Visit: Payer: Self-pay

## 2020-01-28 ENCOUNTER — Ambulatory Visit (HOSPITAL_COMMUNITY)
Admission: EM | Admit: 2020-01-28 | Discharge: 2020-01-28 | Disposition: A | Discharge status: Home / Self Care | Payer: 59 | Attending: Urgent Care | Admitting: Urgent Care

## 2020-01-28 ENCOUNTER — Encounter (HOSPITAL_COMMUNITY): Payer: Self-pay | Admitting: *Deleted

## 2020-01-28 DIAGNOSIS — R9431 Abnormal electrocardiogram [ECG] [EKG]: Secondary | ICD-10-CM | POA: Diagnosis present

## 2020-01-28 DIAGNOSIS — R6883 Chills (without fever): Secondary | ICD-10-CM | POA: Diagnosis not present

## 2020-01-28 DIAGNOSIS — I10 Essential (primary) hypertension: Secondary | ICD-10-CM

## 2020-01-28 DIAGNOSIS — R5381 Other malaise: Secondary | ICD-10-CM

## 2020-01-28 LAB — BASIC METABOLIC PANEL
Anion gap: 12 (ref 5–15)
BUN: 12 mg/dL (ref 6–20)
CO2: 25 mmol/L (ref 22–32)
Calcium: 10 mg/dL (ref 8.9–10.3)
Chloride: 101 mmol/L (ref 98–111)
Creatinine, Ser: 1.19 mg/dL (ref 0.61–1.24)
GFR, Estimated: >60 mL/min (ref >60)
Glucose, Bld: 100 mg/dL — ABNORMAL HIGH (ref 70–99)
Potassium: 4.1 mmol/L (ref 3.5–5.1)
Sodium: 138 mmol/L (ref 135–145)

## 2020-01-28 LAB — CBG MONITORING, ED: Glucose-Capillary: 96 mg/dL (ref 70–99)

## 2020-01-28 NOTE — ED Provider Notes (Signed)
[facility]   MRN: [id number] DOB: 1970/09/29  Subjective:   Timothy Swanson is a 49 y.o. male presenting for 4-day history of persistent mental fog, weakness and general malaise.  Symptoms started after patient started eating new edibles which she has since stopped.  Patient is COVID vaccinated. Was tested for COVID 19 on 01/17/2020 and was negative.  Denies having had any fever, confusion, weakness, chest pain, shortness of breath.  Has not had belly pain, dysuria, hematuria.  He can say that he has felt very hot at times, upset stomach.  Has not had diaphoresis.  Denies history of CAD, MI.  No current facility-administered medications for this encounter.  Current Outpatient Medications:  .  lisinopril-hydrochlorothiazide (PRINZIDE,ZESTORETIC) 20-12.5 MG tablet, Take 1 tablet by mouth daily., Disp: , Rfl:  .  sildenafil (REVATIO) 20 MG tablet, SMARTSIG:3-5 Tablet(s) By Mouth, Disp: , Rfl:  .  Cholecalciferol (VITAMIN D3) 50 MCG (2000 UT) capsule, Take 2,000 Units by mouth daily., Disp: , Rfl:  .  potassium chloride (K-DUR,KLOR-CON) 10 MEQ tablet, Take 10 mEq by mouth 2 (two) times daily., Disp: , Rfl:    No Known Allergies  Past Medical History:  Diagnosis Date  . GERD (gastroesophageal reflux disease)   . Hypertension      History reviewed. No pertinent surgical history.  Family History  Problem Relation Age of Onset  . Hypertension Mother     Social History   Tobacco Use  . Smoking status: Never Smoker  . Smokeless tobacco: Never Used  Substance Use Topics  . Alcohol use: Not Currently    Alcohol/week: 0.0 standard drinks  . Drug use: Not Currently    ROS   Objective:   Vitals: BP 117/80 (BP Location: Right Arm)   Pulse (!) 59   Temp 98.1 F (36.7 C) (Oral)   Resp 15   Ht 5\' 10"  (1.778 m)   Wt 199 lb (90.3 kg)   SpO2 98%   BMI 28.55 kg/m   Physical Exam Constitutional:      General: He is not in acute distress.    Appearance:  Normal appearance. He is well-developed and well-nourished. He is not ill-appearing, toxic-appearing or diaphoretic.  HENT:     Head: Normocephalic and atraumatic.     Right Ear: External ear normal.     Left Ear: External ear normal.     Nose: Nose normal.     Mouth/Throat:     Mouth: Oropharynx is clear and moist. Mucous membranes are moist.     Pharynx: Oropharynx is clear.  Eyes:     General: No scleral icterus.       Right eye: No discharge.        Left eye: No discharge.     Extraocular Movements: Extraocular movements intact.     Conjunctiva/sclera: Conjunctivae normal.     Pupils: Pupils are equal, round, and reactive to light.  Cardiovascular:     Rate and Rhythm: Normal rate and regular rhythm.     Pulses: Intact distal pulses.     Heart sounds: Normal heart sounds. No murmur heard. No friction rub. No gallop.   Pulmonary:     Effort: Pulmonary effort is normal. No respiratory distress.     Breath sounds: Normal breath sounds. No stridor. No wheezing, rhonchi or rales.  Abdominal:     General: Bowel sounds are normal. There is no distension.     Palpations: Abdomen is soft. There is no mass.  Tenderness: There is no abdominal tenderness. There is no guarding or rebound.  Skin:    General: Skin is warm and dry.  Neurological:     Mental Status: He is alert and oriented to person, place, and time.  Psychiatric:        Mood and Affect: Mood and affect and mood normal.        Behavior: Behavior normal.        Thought Content: Thought content normal.        Judgment: Judgment normal.     Results for orders placed or performed during the hospital encounter of 01/28/20 (from the past 24 hour(s))  POC CBG monitoring     Status: None   Collection Time: 01/28/20 12:37 PM  Result Value Ref Range   Glucose-Capillary 96 70 - 99 mg/dL    ED ECG REPORT   Date: 01/28/2020  Rate: 57bpm  Rhythm: sinus bradycardia  QRS Axis: normal  Intervals: normal  ST/T Wave  abnormalities: nonspecific T wave changes  Conduction Disutrbances:none  Narrative Interpretation: Sinus bradycardia 57 bpm with nonspecific sinus arrhythmia, T wave inversion in lead aVF, T wave flattening in II, V6.  Old EKG Reviewed: none available  I have personally reviewed the EKG tracing and agree with the computerized printout as noted.   Assessment and Plan :   PDMP not reviewed this encounter.  1. Malaise   2. Essential hypertension   3. Nonspecific abnormal electrocardiogram (ECG) (EKG)     Patient has nonspecific general symptoms.  His EKG is abnormal but does not show any evidence of ACS, suggest history of previous septal infarct.  No previous EKG for comparison.  Patient does not have any chest pain, abdominal pain, diaphoresis, neck pain, jaw pain, arm pain.  Counseled patient on strict ER precautions for heart event.  Recommended to follow-up as soon as possible with cardiology, referral pending.  Follow-up with PCP today as they are intending on getting more labs.  Start a baby aspirin, maintain blood pressure medications. Counseled patient on potential for adverse effects with medications prescribed/recommended today, ER and return-to-clinic precautions discussed, patient verbalized understanding.    Wallis Bamberg, PA-C 01/28/20 1336

## 2020-01-28 NOTE — ED Triage Notes (Signed)
Pt reports not felling well since he  Had some etibles over weekend. Pt repots feeling fuzzy headed since then. Pt denies any N/V/D.

## 2020-01-28 NOTE — Discharge Instructions (Signed)
Please follow up with [facility] cardiology. You have an abnormal ecg, suggests you had a heart event at some unknown time. This definitely means you should be evaluated by a heart doctor. Follow up with your regular doctor as soon as possible. Start taking a daily baby aspirin. Do not smoke or use drugs, no alcohol use.

## 2020-01-30 ENCOUNTER — Encounter (HOSPITAL_COMMUNITY): Payer: Self-pay | Admitting: Emergency Medicine

## 2020-01-30 ENCOUNTER — Emergency Department (HOSPITAL_COMMUNITY): Payer: 59

## 2020-01-30 ENCOUNTER — Other Ambulatory Visit: Payer: Self-pay

## 2020-01-30 ENCOUNTER — Emergency Department (HOSPITAL_COMMUNITY)
Admission: EM | Admit: 2020-01-30 | Discharge: 2020-01-30 | Disposition: A | Discharge status: Home / Self Care | Payer: 59 | Attending: Emergency Medicine | Admitting: Emergency Medicine

## 2020-01-30 DIAGNOSIS — Z20822 Contact with and (suspected) exposure to covid-19: Secondary | ICD-10-CM | POA: Insufficient documentation

## 2020-01-30 DIAGNOSIS — Z5321 Procedure and treatment not carried out due to patient leaving prior to being seen by health care provider: Secondary | ICD-10-CM | POA: Insufficient documentation

## 2020-01-30 DIAGNOSIS — R0602 Shortness of breath: Secondary | ICD-10-CM | POA: Insufficient documentation

## 2020-01-30 LAB — RESP PANEL BY RT-PCR (FLU A&B, COVID) ARPGX2
Influenza A by PCR: NEGATIVE
Influenza B by PCR: NEGATIVE
SARS Coronavirus 2 by RT PCR: NEGATIVE

## 2020-01-30 NOTE — ED Triage Notes (Signed)
Pt c/o feeling hot, then chilled and shortness of breath x 4 days.

## 2020-01-30 NOTE — ED Notes (Signed)
Pt said wait was to long Pt told risk of leaving 

## 2020-02-11 NOTE — Progress Notes (Signed)
CARDIOLOGY CONSULT NOTE       Patient ID: Timothy Swanson MRN: [id number] DOB/AGE: Aug 11, 1970 50 y.o.  Admit date: (Not on file) Referring Physician:  Wallis Bamberg PA-C [place] Primary Physician: Ileana Ladd, MD Primary Cardiologist: New Reason for Consultation: Abnormal ECG/Malaise   Active Problems:   * No active hospital problems. *   HPI:  50 y.o. referred by Wallis Bamberg PA [place] for abnormal ECG and malaise. Reviewed note from 01/28/20 patient has 4 days mental fog weakness and malaise Had been eating edibles No palpitations, dyspnea, chest pain syncope No focal neuro signs He has HTN and takes Prinzide COVID negative and has been vaccinated CXR with NAD normal mediastinum and no CE. ECG with inferolateral T wave changes SR rate 62 no old one to compare  No chest pain no history of vascular disease. Indicates anxiety is an issue and is going to see a therapist soon Has a 51 yo daughter and 12 yo son latter not with his current wife Works at [employer] [place]  [employer] with no issues Does video work as well as radio  ROS All other systems reviewed and negative except as noted above  Past Medical History:  Diagnosis Date  . ADD (attention deficit disorder)   . ED (erectile dysfunction)   . Elevated LDL cholesterol level   . Family history of diabetes mellitus (DM)   . GERD (gastroesophageal reflux disease)   . Hypertension   . Low back strain   . Low blood sugar   . Malaise   . Psoriasis   . Tinea cruris     Family History  Problem Relation Age of Onset  . Hypertension Mother     Social History   Socioeconomic History  . Marital status: Married    Spouse name: Not on file  . Number of children: Not on file  . Years of education: Not on file  . Highest education level: Not on file  Occupational History  . Not on file  Tobacco Use  . Smoking status: Never Smoker  . Smokeless tobacco: Never Used  Substance and Sexual Activity  . Alcohol use: Not  Currently    Alcohol/week: 0.0 standard drinks  . Drug use: Not Currently  . Sexual activity: Not on file  Other Topics Concern  . Not on file  Social History Narrative  . Not on file   Social Determinants of Health   Financial Resource Strain: Not on file  Food Insecurity: Not on file  Transportation Needs: Not on file  Physical Activity: Not on file  Stress: Not on file  Social Connections: Not on file  Intimate Partner Violence: Not on file    Past Surgical History:  Procedure Laterality Date  . NO PAST SURGERIES        Current Outpatient Medications:  .  aspirin EC 81 MG tablet, Take 81 mg by mouth daily., Disp: , Rfl:  .  Cholecalciferol (VITAMIN D3) 50 MCG (2000 UT) capsule, Take 2,000 Units by mouth daily., Disp: , Rfl:  .  clonazePAM (KLONOPIN) 0.5 MG tablet, Take 1 tablet by mouth at bedtime., Disp: , Rfl:  .  lisinopril-hydrochlorothiazide (PRINZIDE,ZESTORETIC) 20-12.5 MG tablet, Take 1 tablet by mouth daily., Disp: , Rfl:  .  omeprazole (PRILOSEC) 20 MG capsule, Take 20 mg by mouth daily., Disp: , Rfl:  .  potassium chloride (K-DUR,KLOR-CON) 10 MEQ tablet, Take 10 mEq by mouth 2 (two) times daily., Disp: , Rfl:  .  sildenafil (REVATIO) 20 MG tablet, SMARTSIG:3-5 Tablet(s) By Mouth, Disp: , Rfl:     Physical Exam: Blood pressure 94/64, pulse 60, height 5\' 10"  (1.778 m), weight 84.8 kg, SpO2 100 %.   Affect appropriate Healthy:  appears stated age HEENT: normal Neck supple with no adenopathy JVP normal no bruits no thyromegaly Lungs clear with no wheezing and good diaphragmatic motion Heart:  S1/S2 no murmur, no rub, gallop or click PMI normal Abdomen: benighn, BS positve, no tenderness, no AAA no bruit.  No HSM or HJR Distal pulses intact with no bruits No edema Neuro non-focal Skin warm and dry No muscular weakness   Labs:  No results found for: WBC, HGB, HCT, MCV, PLT No results for input(s): NA, K, CL, CO2, BUN, CREATININE, CALCIUM, PROT, BILITOT,  ALKPHOS, ALT, AST, GLUCOSE in the last 168 hours.  Invalid input(s): LABALBU No results found for: CKTOTAL, CKMB, CKMBINDEX, TROPONINI No results found for: CHOL No results found for: HDL No results found for: LDLCALC No results found for: TRIG No results found for: CHOLHDL No results found for: LDLDIRECT    Radiology: DG Chest Portable 1 View  Result Date: 01/30/2020 CLINICAL DATA:  Shortness of breath, COVID-19 symptoms EXAM: PORTABLE CHEST 1 VIEW COMPARISON:  None. FINDINGS: No consolidation, features of edema, pneumothorax, or effusion. Pulmonary vascularity is normally distributed. The cardiomediastinal contours are unremarkable. No acute osseous or soft tissue abnormality. IMPRESSION: No acute cardiopulmonary abnormality. Electronically Signed   By: 02/01/2020 M.D.   On: 01/30/2020 02:17    EKG: see HPI   ASSESSMENT AND PLAN:   1. HTN:  Controlled continue current medication will discuss with primary possibly stopping diuretic and K as BP running low and may not need it  2. Abnormal ECG:  Not likely related to symptoms no old one to compare Start with TTE to assess EF/LVH or other structural abnormality No high risk family history for HOCM/DCM.  No symptoms to suggest CAD/ischemia    TTE for abnormal ECG F/U primary routine labs including TSH  F/U with cardiology PRN if TTE normal   Signed: 02/01/2020 02/18/2020, 8:35 AM

## 2020-02-18 ENCOUNTER — Other Ambulatory Visit: Payer: Self-pay

## 2020-02-18 ENCOUNTER — Encounter: Payer: Self-pay | Admitting: Cardiovascular Disease

## 2020-02-18 ENCOUNTER — Ambulatory Visit: Payer: 59 | Admitting: Cardiovascular Disease

## 2020-02-18 VITALS — BP 94/64 | HR 60 | Ht 70.0 in | Wt 187.0 lb

## 2020-02-18 DIAGNOSIS — R9431 Abnormal electrocardiogram [ECG] [EKG]: Secondary | ICD-10-CM | POA: Diagnosis not present

## 2020-02-18 DIAGNOSIS — I10 Essential (primary) hypertension: Secondary | ICD-10-CM | POA: Diagnosis not present

## 2020-02-18 NOTE — Patient Instructions (Addendum)
Medication Instructions:  *If you need a refill on your cardiac medications before your next appointment, please call your pharmacy*  Lab Work: If you have labs (blood work) drawn today and your tests are completely normal, you will receive your results only by: . MyChart Message (if you have MyChart) OR . A paper copy in the mail If you have any lab test that is abnormal or we need to change your treatment, we will call you to review the results.  Testing/Procedures: Your physician has requested that you have an echocardiogram. Echocardiography is a painless test that uses sound waves to create images of your heart. It provides your doctor with information about the size and shape of your heart and how well your heart's chambers and valves are working. This procedure takes approximately one hour. There are no restrictions for this procedure.  Follow-Up: At [facility], you and your health needs are our priority.  As part of our continuing mission to provide you with exceptional heart care, we have created designated Provider Care Teams.  These Care Teams include your primary Cardiologist (physician) and Advanced Practice Providers (APPs -  Physician Assistants and Nurse Practitioners) who all work together to provide you with the care you need, when you need it.  We recommend signing up for the patient portal called "MyChart".  Sign up information is provided on this After Visit Summary.  MyChart is used to connect with patients for Virtual Visits (Telemedicine).  Patients are able to view lab/test results, encounter notes, upcoming appointments, etc.  Non-urgent messages can be sent to your provider as well.   To learn more about what you can do with MyChart, go to [email or web address].    Your next appointment:   As needed  The format for your next appointment:   In Person  Provider:   You may see Dr. Nishan or one of the following Advanced Practice Providers on your designated Care  Team:    Lori Gerhardt, NP  Laura Ingold, NP  Jill McDaniel, NP    

## 2020-03-09 ENCOUNTER — Ambulatory Visit (HOSPITAL_COMMUNITY): Payer: 59 | Attending: Cardiovascular Disease

## 2020-03-09 ENCOUNTER — Telehealth: Payer: Self-pay | Admitting: Cardiovascular Disease

## 2020-03-09 ENCOUNTER — Other Ambulatory Visit: Payer: Self-pay

## 2020-03-09 DIAGNOSIS — R9431 Abnormal electrocardiogram [ECG] [EKG]: Secondary | ICD-10-CM | POA: Diagnosis not present

## 2020-03-09 LAB — ECHOCARDIOGRAM COMPLETE
Area-P 1/2: 4.39 cm2
S' Lateral: 3.4 cm

## 2020-03-09 NOTE — Telephone Encounter (Signed)
Javonne is returning Pamela's call. Please advise.

## 2020-03-09 NOTE — Telephone Encounter (Signed)
Per Dr. Eden Emms, EF normal just mild MR/AR overall good. Called patient with results. Patient verbalized understanding.

## 2020-09-14 ENCOUNTER — Encounter (HOSPITAL_BASED_OUTPATIENT_CLINIC_OR_DEPARTMENT_OTHER): Payer: Self-pay | Admitting: Physical Therapy

## 2020-09-14 ENCOUNTER — Other Ambulatory Visit: Payer: Self-pay

## 2020-09-14 ENCOUNTER — Ambulatory Visit (HOSPITAL_BASED_OUTPATIENT_CLINIC_OR_DEPARTMENT_OTHER): Payer: 59 | Attending: Sports Medicine | Admitting: Physical Therapy

## 2020-09-14 DIAGNOSIS — M6281 Muscle weakness (generalized): Secondary | ICD-10-CM | POA: Diagnosis present

## 2020-09-14 DIAGNOSIS — M25612 Stiffness of left shoulder, not elsewhere classified: Secondary | ICD-10-CM

## 2020-09-14 DIAGNOSIS — M25512 Pain in left shoulder: Secondary | ICD-10-CM | POA: Insufficient documentation

## 2020-09-15 NOTE — Therapy (Signed)
[facility] [address] [place], [place], [address] Phone: [phone]   Fax:  [phone]  Physical Therapy Evaluation  Patient Details  Name: Timothy Swanson MRN: [id number] Date of Birth: 1970-11-05 Referring Provider (PT): Christena Deem, MD   Encounter Date: 09/14/2020   PT End of Session - 09/14/20 2357     Visit Number 1    Number of Visits 12    Date for PT Re-Evaluation 10/13/20    Authorization Type U[employer]    Progress Note Due on Visit --   10/13/2020   PT Start Time 0848    PT Stop Time 0943    PT Time Calculation (min) 55 min    Activity Tolerance Patient tolerated treatment well    Behavior During Therapy [facility] for tasks assessed/performed             Past Medical History:  Diagnosis Date   ADD (attention deficit disorder)    ED (erectile dysfunction)    Elevated LDL cholesterol level    Family history of diabetes mellitus (DM)    GERD (gastroesophageal reflux disease)    Hypertension    Low back strain    Low blood sugar    Malaise    Psoriasis    Tinea cruris     Past Surgical History:  Procedure Laterality Date   NO PAST SURGERIES      There were no vitals filed for this visit.    Subjective Assessment - 09/14/20 0854     Subjective Pt began having pain at the end of June.  Pt had Covid and was sleeping in a pull-out couch when he woke up with L shoulder pain.  He denies any specific MOI.  He was sitting on the pullout couch using his computer the night before he had pain.  Pt made a virtual appointment with NP and received a prescription of prednisone.  Pt reports he didn't receive much relief and was referred to an orthopedic.  Pt saw orthopedic who performed an [place] and gave pt a cortisone injection which didn't provide any relief.  Pt states he was received another round of prednisone and received good relief from this round.  Pt has been performing T-band exercises he received from MD including  ER/IR and rows.  Pt reports he was having much pain at night having difficulty sleeping though has been much better since 2nd round of prednisone.  Pt has pain sitting in car and driving or sitting for an extended period of time.  Pt reports having tightness and weakness with reaching activities and ADLs.  pt states he favors R UE with ADLs including bathing and washing hair and has some pain with applying wt thru L UE.  Pt is limited with his normal workouts.  Pain with work Loss adjuster, [employer].  Pt had 3 deep tissue massages which also helped.    Pertinent History Psoriasis    Limitations Sitting    How long can you sit comfortably? 5-7 mins    How long can you walk comfortably? not limited    Diagnostic tests US--Pt states US showed no tears and he thinks MD informed he had RTC tendinitis.    Patient Stated Goals to be pain free, learning an exercise routine to prevent pain    Currently in Pain? Yes    Pain Score 4    2-3/10 best, 8/10 worst   Pain Location Shoulder    Pain Orientation Left    Aggravating Factors  sitting in car and driving, bathing/washing hair, applying wt thru L UE    Pain Relieving Factors 2nd round of prednisone    Effect of Pain on Daily Activities driving, bathing, transfes with applying wt thru L UE                [facility] PT Assessment - 09/15/20 0001       Assessment   Medical Diagnosis L shoulder pain    Referring Provider (PT) Christena Deem, MD    Onset Date/Surgical Date --   Late June   Hand Dominance Right    Next MD Visit 10/08/2020    Prior Therapy no   Pt has had 3 deep tissue massages     Precautions   Precautions None      Restrictions   Weight Bearing Restrictions No      Home Environment   Living Environment Private residence    Additional Comments pt lives in 2 story home with his wife      Prior Function   Level of Independence Independent   Pt was able to perform all of his ADLs/IADLs and reaching activities without shoulder  pain or difficulty.  Pt was able to drive without shoulder pain.  Pt was able to sleep without disturbance from shoulder.   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 [employer] work    Leisure Working [employer]   Overall Cognitive Status Within [employer] for tasks assessed      Observation/Other Assessments   Other Surveys  Upper Extremity Functional Index    Upper Extremity Functional Index  43      Posture/Postural Control   Posture Comments rounded shoulders and slumped sitting posture      AROM   AROM Assessment Site Shoulder    Right Shoulder Flexion 158 Degrees    Right Shoulder ABduction 150 Degrees   Scaption:  152   Right Shoulder Internal Rotation 62 Degrees    Right Shoulder External Rotation 85 Degrees    Left Shoulder Flexion 108 Degrees    Left Shoulder ABduction 90 Degrees   106   Left Shoulder Internal Rotation 72 Degrees    Left Shoulder External Rotation 78 Degrees      PROM   Left Shoulder Flexion 152 Degrees    Left Shoulder ABduction 148 Degrees    Left Shoulder External Rotation 84 Degrees      Strength   Right Shoulder Internal Rotation 5/5    Right Shoulder External Rotation 5/5    Left Shoulder Internal Rotation 4/5    Left Shoulder External Rotation 4-/5      Palpation   Palpation comment TTP:  anterior and lateral L shoulder      Special Tests    Special Tests Rotator Cuff Impingement    Rotator Cuff Impingment tests Neer impingement test;Hawkins- Kennedy test      Neer Impingement test    Findings Positive    Comments Negative on R      Hawkins-Kennedy test   Findings Negative    Comments Bilat                        Objective measurements completed on examination: See above findings.       [facility] Adult PT Treatment/Exercise - 09/14/20 0001       Exercises   Exercises Shoulder      Shoulder  Exercises: Supine   Other Supine Exercises wand  flexion x15 reps      Shoulder Exercises: Standing   Other Standing Exercises scap retraction with 3 sec hold x 15 reps                    PT Education - 09/14/20 2357     Education Details Educated pt concerning dx and POC.  Answered Pt's questions.  Educated pt concerning proper posture. Pt received a HEP handout and was educated in correct form and appropriate frequency.  Pt instructed to not perform AAROM into a painful range and he should not have increased pain with HEP.  Access Code: [email or web address]  URL: [email or web address]  Date: 09/15/2020  Prepared by: Aaron Edelmanrey Yulianna Folse    Exercises  Seated Scapular Retraction - 2 x daily - 7 x weekly - 2 sets - 10 reps - 3 second hold  Supine Shoulder Flexion Extension AAROM with Dowel - 2 x daily - 7 x weekly - 2 sets - 10 reps    Person(s) Educated Patient    Methods Explanation;Demonstration;Verbal cues;Tactile cues;Handout    Comprehension Verbal cues required;Returned demonstration;Verbalized understanding;Tactile cues required              PT Short Term Goals - 09/14/20 2357       PT SHORT TERM GOAL #1   Title Pt will be independent and compliant with HEP for improved pain, ROM, strength, and function.    Time 3    Period Weeks    Status New    Target Date 10/05/20      PT SHORT TERM GOAL #2   Title Pt will demo at least 10-15 deg of AROM in flexion, scaption, and abd for improved reaching and functional mobility.    Time 3    Period Weeks    Status New    Target Date 10/05/20      PT SHORT TERM GOAL #3   Title Pt will report at least a 25% improvement in pain with driving and using computer    Time 3    Period Weeks    Status New    Target Date 10/05/20      PT SHORT TERM GOAL #4   Title Pt will be able to wash his hair without difficulty or significant pain.    Time 4    Period Weeks    Status New    Target Date 10/12/20               PT Long Term Goals - 09/14/20 2358       PT LONG TERM  GOAL #1   Title Pt will demo improved L shoulder flex, scaption, and abd AROM to be w/n 5 deg of R shoulder for improved reaching overhead.    Time 6    Period Weeks    Status New    Target Date 10/26/20      PT LONG TERM GOAL #2   Title Pt will report he is able to perform his normal overhead activities including reaching into a cabinet without increased pain or difficulty.    Time 6    Period Weeks    Status New    Target Date 10/26/20      PT LONG TERM GOAL #3   Title Pt will be able to drive without significant pain.    Time 6    Period Weeks    Status New    Target Date 10/26/20  PT LONG TERM GOAL #4   Title Pt will demo 5/5 L shoulder strength t/o for functional carrying/lifting and to assist with returning to workout activities.    Time 6    Period Weeks    Status New    Target Date 10/26/20      PT LONG TERM GOAL #5   Title Pt will be able to perform his ADLs and IADLs without significant pain or difficulty    Time 6    Period Weeks    Status New    Target Date 10/26/20                    Plan - 09/14/20 2357     Clinical Impression Statement Pt is a 50 y/o male with a dx of acute pain in L shoulder presenting to the clinic with L shoulder pain, muscle weakness in L shoulder, and limited AROM in L shoulder elevation.  Pt has been feeling better since receiving his 2nd round of prednisone.   He has less pain at night and is able to sleep better.  Pt has pain sitting in car and driving or sitting for an extended period of time. He has limitations with reaching activities and ADLs and favors R UE with ADLs including bathing and washing hair.  Pt uses computer for work and has pain with sitting using the computer.  He is unable to perform his normal workout activitis.  Pt should benefit well from skilled PT services to address goals, improve ROM and strength, and restore PLOF.    Personal Factors and Comorbidities Comorbidity 1    Comorbidities psoriasis     Examination-Activity Limitations Bathing;Reach Overhead    Examination-Participation Restrictions Occupation;Driving    Stability/Clinical Decision Making Stable/Uncomplicated    Clinical Decision Making Low    Rehab Potential Good    PT Frequency 2x / week    PT Duration 6 weeks    PT Treatment/Interventions ADLs/Self Care Home Management;Cryotherapy;Electrical Stimulation;Ultrasound;Moist Heat;Therapeutic activities;Therapeutic exercise;Neuromuscular re-education;Manual techniques;Patient/family education;Passive range of motion;Dry needling    PT Next Visit Plan Review and perform HEP.  progress ther ex.  manual techniques including jt mobs and PROM.    PT Home Exercise Plan Access Code: [email or web address]  URL: [email or web address]  Date: 09/15/2020  Prepared by: Aaron Edelman    Exercises  Seated Scapular Retraction - 2 x daily - 7 x weekly - 2 sets - 10 reps - 3 second hold  Supine Shoulder Flexion Extension AAROM with Dowel - 2 x daily - 7 x weekly - 2 sets - 10 reps    Consulted and Agree with Plan of Care Patient             Patient will benefit from skilled therapeutic intervention in order to improve the following deficits and impairments:  Decreased range of motion, Decreased strength, Impaired flexibility, Postural dysfunction, Pain, Decreased activity tolerance  Visit Diagnosis: Acute pain of left shoulder  Muscle weakness (generalized)  Stiffness of left shoulder, not elsewhere classified     Problem List There are no problems to display for this patient.   Audie Clear III PT, DPT 09/15/20 7:05 AM   [facility] [address] [place], [place], [address] Phone: [phone]   Fax:  [phone]  Name: Timothy Swanson MRN: [id number] Date of Birth: September 06, 1970

## 2020-09-18 ENCOUNTER — Ambulatory Visit (HOSPITAL_BASED_OUTPATIENT_CLINIC_OR_DEPARTMENT_OTHER): Payer: 59 | Admitting: Physical Therapy

## 2020-09-18 ENCOUNTER — Encounter (HOSPITAL_BASED_OUTPATIENT_CLINIC_OR_DEPARTMENT_OTHER): Payer: Self-pay | Admitting: Physical Therapy

## 2020-09-18 ENCOUNTER — Other Ambulatory Visit: Payer: Self-pay

## 2020-09-18 DIAGNOSIS — M25512 Pain in left shoulder: Secondary | ICD-10-CM

## 2020-09-18 DIAGNOSIS — M25612 Stiffness of left shoulder, not elsewhere classified: Secondary | ICD-10-CM

## 2020-09-18 DIAGNOSIS — M6281 Muscle weakness (generalized): Secondary | ICD-10-CM

## 2020-09-18 NOTE — Therapy (Signed)
[facility] [address] [place], [place], [address] Phone: [phone]   Fax:  [phone]  Physical Therapy Treatment  Patient Details  Name: Timothy Swanson MRN: [id number] Date of Birth: 16-Jan-1971 Referring Provider (PT): Christena Deem, MD   Encounter Date: 09/18/2020   PT End of Session - 09/18/20 1426     Visit Number 2    Number of Visits 12    Date for PT Re-Evaluation 10/13/20    Progress Note Due on Visit --   10/13/2020   PT Start Time 0852    PT Stop Time 0937    PT Time Calculation (min) 45 min    Activity Tolerance Patient tolerated treatment well    Behavior During Therapy [facility] for tasks assessed/performed             Past Medical History:  Diagnosis Date   ADD (attention deficit disorder)    ED (erectile dysfunction)    Elevated LDL cholesterol level    Family history of diabetes mellitus (DM)    GERD (gastroesophageal reflux disease)    Hypertension    Low back strain    Low blood sugar    Malaise    Psoriasis    Tinea cruris     Past Surgical History:  Procedure Laterality Date   NO PAST SURGERIES      There were no vitals filed for this visit.   Subjective Assessment - 09/18/20 0854     Subjective Pt denies any adverse effects after prior Rx.  Pt reports compliance with HEP.  Pt was really sore at work yesterday but thinks it was due to him bending over.  Pt reports compliance with HEP but has been performing wand flexion standing instead of supine.    Pertinent History Psoriasis    Diagnostic tests US--Pt states US showed no tears and he thinks MD informed he had RTC tendinitis.    Currently in Pain? Yes    Pain Score --   1-2/10   Pain Location Shoulder    Pain Orientation Left    Effect of Pain on Daily Activities driving, bathing, transfes with applying wt thru L UE                [facility] PT Assessment - 09/18/20 0001       AROM   AROM Assessment Site Shoulder    Left  Shoulder Flexion 120 Degrees                           [facility] Adult PT Treatment/Exercise - 09/18/20 0001       Exercises   Exercises Shoulder   Reviewed response to prior Rx, HEP compiance, and pain level.     Shoulder Exercises: Supine   Other Supine Exercises wand flexion x15 reps    Other Supine Exercises supine Shoulder ABC x 1 rep, supine serratus punches 2x10 reps      Shoulder Exercises: Standing   Other Standing Exercises scap retraction with 3 sec hold x 15 reps    Other Standing Exercises standing wand flexion x 12 reps.  standing wall walks (AAROM) in flexion 1 set on wall and 1 set on ladder x 10 reps each      Manual Therapy   Manual Therapy Joint mobilization;Passive ROM    Joint Mobilization Grade I-II PA and inferior jt mobs to L GH to improve pain, stiffness/ROM, and to normalize arthrokinematics  Passive ROM f/b L shoulder PROM in flex, abd, and ER                    PT Education - 09/18/20 0939     Education Details Reviewed and performed HEP.  Instructed pt in correct form with exercises.  Updated HEP and gave pt a HEP hanodut.  Educated pt in correct form and appropriate frequency.  Access Code: [email or web address]  URL: [email or web address]  Date: 09/18/2020  Prepared by: Aaron Edelman   Standing Shoulder Flexion Wall Walk - 2 x daily - 7 x weekly - 1 sets - 10 reps  Supine Shoulder Alphabet - 1 x daily - 7 x weekly - 1 sets - 1 reps.  Instructed pt to not perform AAROM into a painful or tight range.    Person(s) Educated Patient    Methods Verbal cues;Tactile cues;Demonstration;Explanation    Comprehension Verbalized understanding;Returned demonstration              PT Short Term Goals - 09/14/20 2357       PT SHORT TERM GOAL #1   Title Pt will be independent and compliant with HEP for improved pain, ROM, strength, and function.    Time 3    Period Weeks    Status New    Target Date 10/05/20      PT SHORT TERM GOAL  #2   Title Pt will demo at least 10-15 deg of AROM in flexion, scaption, and abd for improved reaching and functional mobility.    Time 3    Period Weeks    Status New    Target Date 10/05/20      PT SHORT TERM GOAL #3   Title Pt will report at least a 25% improvement in pain with driving and using computer    Time 3    Period Weeks    Status New    Target Date 10/05/20      PT SHORT TERM GOAL #4   Title Pt will be able to wash his hair without difficulty or significant pain.    Time 4    Period Weeks    Status New    Target Date 10/12/20               PT Long Term Goals - 09/14/20 2358       PT LONG TERM GOAL #1   Title Pt will demo improved L shoulder flex, scaption, and abd AROM to be w/n 5 deg of R shoulder for improved reaching overhead.    Time 6    Period Weeks    Status New    Target Date 10/26/20      PT LONG TERM GOAL #2   Title Pt will report he is able to perform his normal overhead activities including reaching into a cabinet without increased pain or difficulty.    Time 6    Period Weeks    Status New    Target Date 10/26/20      PT LONG TERM GOAL #3   Title Pt will be able to drive without significant pain.    Time 6    Period Weeks    Status New    Target Date 10/26/20      PT LONG TERM GOAL #4   Title Pt will demo 5/5 L shoulder strength t/o for functional carrying/lifting and to assist with returning to workout activities.    Time 6    Period Weeks  Status New    Target Date 10/26/20      PT LONG TERM GOAL #5   Title Pt will be able to perform his ADLs and IADLs without significant pain or difficulty    Time 6    Period Weeks    Status New    Target Date 10/26/20                   Plan - 09/18/20 1417     Clinical Impression Statement Pt has completed prednisone and is doing better since his 2nd round of prednisone.  Pt ha been compliant with HEP though performing standing AAROM with wand.  Pt required verbal, visual,  and tactile cuing for correct form with standing wand flexion.  PT used a Ship [employer] for visual cuing for correct form.  Reviwed and updated HEP.  Pt does have some pain  with flexion PROM and is limited with flexion and scaption PROM.  Pt demonstrates improved L shoulder flexion AROM by 12 deg today compared to prior Rx/eval.  Pt responded well to Rx having no c/o's or increased pain after Rx.  Pt should benefit from cont skilled PT services to address ongoing goals and to restore PLOF.    Comorbidities psoriasis    PT Treatment/Interventions ADLs/Self Care Home Management;Cryotherapy;Electrical Stimulation;Ultrasound;Moist Heat;Therapeutic activities;Therapeutic exercise;Neuromuscular re-education;Manual techniques;Patient/family education;Passive range of motion;Dry needling    PT Next Visit Plan Review and perform HEP.  progress ther ex.  manual techniques including jt mobs and PROM.    PT Home Exercise Plan Access Code: [email or web address]  URL: [email or web address]  Date: 09/18/2020  Prepared by: Aaron Edelman    Exercises  Seated Scapular Retraction - 2 x daily - 7 x weekly - 2 sets - 10 reps - 3 second hold  Supine Shoulder Flexion Extension AAROM with Dowel - 2 x daily - 7 x weekly - 2 sets - 10 reps  Standing Shoulder Flexion Wall Walk - 2 x daily - 7 x weekly - 1 sets - 10 reps  Supine Shoulder Alphabet - 1 x daily - 7 x weekly - 1 sets - 1 reps.  Instructed pt to not perform AAROM into a painful or tight range    Consulted and Agree with Plan of Care Patient             Patient will benefit from skilled therapeutic intervention in order to improve the following deficits and impairments:  Decreased range of motion, Decreased strength, Impaired flexibility, Postural dysfunction, Pain, Decreased activity tolerance  Visit Diagnosis: Acute pain of left shoulder  Muscle weakness (generalized)  Stiffness of left shoulder, not elsewhere classified     Problem List There are no problems  to display for this patient.  Audie Clear III PT, DPT 09/18/20 2:32 PM   [facility] [address] [place], [place], [address] Phone: [phone]   Fax:  [phone]  Name: Timothy Swanson MRN: [id number] Date of Birth: 18-Feb-1970

## 2020-09-21 ENCOUNTER — Other Ambulatory Visit: Payer: Self-pay

## 2020-09-21 ENCOUNTER — Encounter (HOSPITAL_BASED_OUTPATIENT_CLINIC_OR_DEPARTMENT_OTHER): Payer: Self-pay | Admitting: Physical Therapy

## 2020-09-21 ENCOUNTER — Ambulatory Visit (HOSPITAL_BASED_OUTPATIENT_CLINIC_OR_DEPARTMENT_OTHER): Payer: 59 | Admitting: Physical Therapy

## 2020-09-21 DIAGNOSIS — M6281 Muscle weakness (generalized): Secondary | ICD-10-CM

## 2020-09-21 DIAGNOSIS — M25612 Stiffness of left shoulder, not elsewhere classified: Secondary | ICD-10-CM

## 2020-09-21 DIAGNOSIS — M25512 Pain in left shoulder: Secondary | ICD-10-CM

## 2020-09-21 NOTE — Therapy (Signed)
[facility] [address] [place], [place], [address] Phone: [phone]   Fax:  [phone]  Physical Therapy Treatment  Patient Details  Name: Timothy Swanson MRN: [id number] Date of Birth: 02-21-1970 Referring Provider (PT): Christena Deem, MD   Encounter Date: 09/21/2020   PT End of Session - 09/21/20 1335     Visit Number 3    Number of Visits 12    Date for PT Re-Evaluation 10/13/20    Authorization Type UHC    PT Start Time 0912    PT Stop Time 0945    PT Time Calculation (min) 33 min    Activity Tolerance Patient tolerated treatment well    Behavior During Therapy [facility] for tasks assessed/performed             Past Medical History:  Diagnosis Date   ADD (attention deficit disorder)    ED (erectile dysfunction)    Elevated LDL cholesterol level    Family history of diabetes mellitus (DM)    GERD (gastroesophageal reflux disease)    Hypertension    Low back strain    Low blood sugar    Malaise    Psoriasis    Tinea cruris     Past Surgical History:  Procedure Laterality Date   NO PAST SURGERIES      There were no vitals filed for this visit.   Subjective Assessment - 09/21/20 0929     Subjective Pt denies any adverse effects after prior Rx.  Pt reports compliance with HEP.  Pt reports compliance with HEP.  Pt reports improved driving.    Pertinent History Psoriasis    Diagnostic tests US--Pt states US showed no tears and he thinks MD informed he had RTC tendinitis.    Pain Score 1     Pain Location Shoulder    Pain Orientation Left    Aggravating Factors  sitting in car and driving, bathing/wahsing hair, applying wth thru L UE.    Pain Relieving Factors 2nd round of prednisone    Effect of Pain on Daily Activities driving, bathing, transfers with applying wt thru L UE                               [facility] Adult PT Treatment/Exercise - 09/21/20 0001       Exercises    Exercises Shoulder   Reviewed response to prior Rx, current function, HEP compiance, and pain level.     Shoulder Exercises: Supine   Other Supine Exercises supine Shoulder ABC x 1 rep, supine serratus punches 2x10 reps      Shoulder Exercises: Prone   Other Prone Exercises Prone extension 2x10, prone row 2x10      Shoulder Exercises: Sidelying   Other Sidelying Exercises S/L ER 2x10      Shoulder Exercises: Standing   Other Standing Exercises scap retraction with 3 sec hold x 10 reps, rows with retraction with RTB    Other Standing Exercises standing wall walks (AAROM) in flexion 1 set on wall and 1 set on ladder x 10 reps each      Manual Therapy   Manual Therapy Joint mobilization;Passive ROM    Joint Mobilization Grade I-II PA and inferior jt mobs to L GH to improve pain, stiffness/ROM, and to normalize arthrokinematics    Passive ROM f/b L shoulder PROM in flex, abd, and ER  PT Education - 09/21/20 1335     Education Details Reviewed and performed HEP.  Instructed pt in correct form with exercises.  instructed pt to cont with HEP.   Answered Pt's questions.    Person(s) Educated Patient    Methods Explanation;Demonstration;Verbal cues;Tactile cues    Comprehension Verbalized understanding;Returned demonstration              PT Short Term Goals - 09/14/20 2357       PT SHORT TERM GOAL #1   Title Pt will be independent and compliant with HEP for improved pain, ROM, strength, and function.    Time 3    Period Weeks    Status New    Target Date 10/05/20      PT SHORT TERM GOAL #2   Title Pt will demo at least 10-15 deg of AROM in flexion, scaption, and abd for improved reaching and functional mobility.    Time 3    Period Weeks    Status New    Target Date 10/05/20      PT SHORT TERM GOAL #3   Title Pt will report at least a 25% improvement in pain with driving and using computer    Time 3    Period Weeks    Status New    Target  Date 10/05/20      PT SHORT TERM GOAL #4   Title Pt will be able to wash his hair without difficulty or significant pain.    Time 4    Period Weeks    Status New    Target Date 10/12/20               PT Long Term Goals - 09/14/20 2358       PT LONG TERM GOAL #1   Title Pt will demo improved L shoulder flex, scaption, and abd AROM to be w/n 5 deg of R shoulder for improved reaching overhead.    Time 6    Period Weeks    Status New    Target Date 10/26/20      PT LONG TERM GOAL #2   Title Pt will report he is able to perform his normal overhead activities including reaching into a cabinet without increased pain or difficulty.    Time 6    Period Weeks    Status New    Target Date 10/26/20      PT LONG TERM GOAL #3   Title Pt will be able to drive without significant pain.    Time 6    Period Weeks    Status New    Target Date 10/26/20      PT LONG TERM GOAL #4   Title Pt will demo 5/5 L shoulder strength t/o for functional carrying/lifting and to assist with returning to workout activities.    Time 6    Period Weeks    Status New    Target Date 10/26/20      PT LONG TERM GOAL #5   Title Pt will be able to perform his ADLs and IADLs without significant pain or difficulty    Time 6    Period Weeks    Status New    Target Date 10/26/20                   Plan - 09/21/20 1319     Clinical Impression Statement Pt is improving with pain and sx's and reports reduced pain with driving.  Pt  demonstrates good standing flexion AAROM with standing wall walks.  Progressed exercises today and pt performed well with cuing and instruction in correct form.  Pt demonstrated improved L shoulder PROM today.  Pt responded well to Rx reporting no increased pain after Rx.    PT Treatment/Interventions ADLs/Self Care Home Management;Cryotherapy;Electrical Stimulation;Ultrasound;Moist Heat;Therapeutic activities;Therapeutic exercise;Neuromuscular re-education;Manual  techniques;Patient/family education;Passive range of motion;Dry needling    PT Next Visit Plan progress ther ex including scapular stabs.  add rhythmic stabs in flexion in supine next Rx.  Cont with manual techniques including jt mobs and PROM.    PT Home Exercise Plan Access Code: [email or web address]  URL: [email or web address]  Date: 09/18/2020  Prepared by: Aaron Edelman    Exercises  Seated Scapular Retraction - 2 x daily - 7 x weekly - 2 sets - 10 reps - 3 second hold  Supine Shoulder Flexion Extension AAROM with [place]el - 2 x daily - 7 x weekly - 2 sets - 10 reps  Standing Shoulder Flexion Wall Walk - 2 x daily - 7 x weekly - 1 sets - 10 reps  Supine Shoulder Alphabet - 1 x daily - 7 x weekly - 1 sets - 1 reps.  Instructed pt to not perform AAROM into a painful or tight range    Consulted and Agree with Plan of Care Patient             Patient will benefit from skilled therapeutic intervention in order to improve the following deficits and impairments:  Decreased range of motion, Decreased strength, Impaired flexibility, Postural dysfunction, Pain, Decreased activity tolerance  Visit Diagnosis: Acute pain of left shoulder  Muscle weakness (generalized)  Stiffness of left shoulder, not elsewhere classified     Problem List There are no problems to display for this patient.   Audie Clear III PT, DPT 09/21/20 1:38 PM   [facility] [address] [place], [place], [address] Phone: [phone]   Fax:  [phone]  Name: Timothy Swanson MRN: [id number] Date of Birth: Jun 07, 1970

## 2020-09-28 ENCOUNTER — Other Ambulatory Visit: Payer: Self-pay

## 2020-09-28 ENCOUNTER — Encounter (HOSPITAL_BASED_OUTPATIENT_CLINIC_OR_DEPARTMENT_OTHER): Payer: Self-pay | Admitting: Physical Therapy

## 2020-09-28 ENCOUNTER — Ambulatory Visit (HOSPITAL_BASED_OUTPATIENT_CLINIC_OR_DEPARTMENT_OTHER): Payer: 59 | Admitting: Physical Therapy

## 2020-09-28 DIAGNOSIS — M25612 Stiffness of left shoulder, not elsewhere classified: Secondary | ICD-10-CM

## 2020-09-28 DIAGNOSIS — M25512 Pain in left shoulder: Secondary | ICD-10-CM

## 2020-09-28 DIAGNOSIS — M6281 Muscle weakness (generalized): Secondary | ICD-10-CM

## 2020-09-28 NOTE — Therapy (Signed)
[facility] [address] [place], [place], [address] Phone: [phone]   Fax:  [phone]  Physical Therapy Treatment  Patient Details  Name: Timothy Swanson MRN: [id number] Date of Birth: 05/29/1970 Referring Provider (PT): Christena Deem, MD   Encounter Date: 09/28/2020   PT End of Session - 09/28/20 0919     Visit Number 4    Number of Visits 12    Date for PT Re-Evaluation 10/13/20    Authorization Type UHC    PT Start Time 0847    PT Stop Time 0930    PT Time Calculation (min) 43 min    Activity Tolerance Patient tolerated treatment well    Behavior During Therapy [facility] for tasks assessed/performed             Past Medical History:  Diagnosis Date   ADD (attention deficit disorder)    ED (erectile dysfunction)    Elevated LDL cholesterol level    Family history of diabetes mellitus (DM)    GERD (gastroesophageal reflux disease)    Hypertension    Low back strain    Low blood sugar    Malaise    Psoriasis    Tinea cruris     Past Surgical History:  Procedure Laterality Date   NO PAST SURGERIES      There were no vitals filed for this visit.   Subjective Assessment - 09/28/20 0850     Subjective Pt denies any adverse effects after prior Rx.  Pt reports compliance with HEP.  Pt has pain with driving.  Pt states his shoulder was a little sore yesterday though may be from playing with Wii or using computer.  Pt reports improved shoulder mobility and is able to reach higher on wall.  Pt states if he has pain, he does some of his exercises and feels better.    Pertinent History Psoriasis    Currently in Pain? Yes    Pain Score 3     Pain Location Shoulder   and L UT               [facility] PT Assessment - 09/28/20 0001       AROM   Left Shoulder Flexion 142 Degrees    Left Shoulder ABduction 121 Degrees   scaption:  141                          [facility] Adult PT Treatment/Exercise  - 09/28/20 0001       Exercises   Exercises Shoulder   Reviewed response to prior Rx, current function, HEP compiance, and pain level.  Assessed shoulder ROM.     Shoulder Exercises: Supine   Other Supine Exercises supine Shoulder ABC x 1 rep iwith 1#, supine serratus punches 1x10 reps each with 1/2#, rhhytmic stabs at 90 deg flexion      Shoulder Exercises: Prone   Other Prone Exercises Prone extension 2x10 with 1#, prone row 2x10 with 1#      Shoulder Exercises: Sidelying   Other Sidelying Exercises S/L ER x 10 reps AROM and x 10 reps with 1#      Shoulder Exercises: Standing   Other Standing Exercises standing wall walks (AAROM) in flexion 1 set on wall x 10 reps.  4D ball rolls on wall at 90 deg flexion x 10 reps each.      Manual Therapy   Manual Therapy Joint mobilization;Passive ROM  Joint Mobilization Grade I-II PA and inferior jt mobs to L GH to improve pain, stiffness/ROM, and to normalize arthrokinematics    Passive ROM f/b L shoulder PROM in flex, abd, and scaption                    PT Education - 09/28/20 2131     Education Details Educated pt in relevant anatomy and dx.  Answered pt's questions.  Instructed pt in correct form with exercises. Reviewed HEP.    Person(s) Educated Patient    Methods Explanation;Demonstration;Verbal cues    Comprehension Verbalized understanding;Returned demonstration              PT Short Term Goals - 09/14/20 2357       PT SHORT TERM GOAL #1   Title Pt will be independent and compliant with HEP for improved pain, ROM, strength, and function.    Time 3    Period Weeks    Status New    Target Date 10/05/20      PT SHORT TERM GOAL #2   Title Pt will demo at least 10-15 deg of AROM in flexion, scaption, and abd for improved reaching and functional mobility.    Time 3    Period Weeks    Status New    Target Date 10/05/20      PT SHORT TERM GOAL #3   Title Pt will report at least a 25% improvement in pain with  driving and using computer    Time 3    Period Weeks    Status New    Target Date 10/05/20      PT SHORT TERM GOAL #4   Title Pt will be able to wash his hair without difficulty or significant pain.    Time 4    Period Weeks    Status New    Target Date 10/12/20               PT Long Term Goals - 09/14/20 2358       PT LONG TERM GOAL #1   Title Pt will demo improved L shoulder flex, scaption, and abd AROM to be w/n 5 deg of R shoulder for improved reaching overhead.    Time 6    Period Weeks    Status New    Target Date 10/26/20      PT LONG TERM GOAL #2   Title Pt will report he is able to perform his normal overhead activities including reaching into a cabinet without increased pain or difficulty.    Time 6    Period Weeks    Status New    Target Date 10/26/20      PT LONG TERM GOAL #3   Title Pt will be able to drive without significant pain.    Time 6    Period Weeks    Status New    Target Date 10/26/20      PT LONG TERM GOAL #4   Title Pt will demo 5/5 L shoulder strength t/o for functional carrying/lifting and to assist with returning to workout activities.    Time 6    Period Weeks    Status New    Target Date 10/26/20      PT LONG TERM GOAL #5   Title Pt will be able to perform his ADLs and IADLs without significant pain or difficulty    Time 6    Period Weeks    Status New  Target Date 10/26/20                   Plan - 09/28/20 0855     Clinical Impression Statement Pt is progressing well with shoulder mobility as evidenced by subjective reports and objective findings.  He demonstrates improved AROM t/o L shoulder elevation including flexion, scaption, and abd. Pt continues to have pain with driving.  PT progressed exercises with adding light resistance to exercises and also adding supine rhythmic stabs and standing 4D ball rolls.  Pt was fatigued with 4D ball rolls on wall.  He responded well to Rx reporting improved pain from 3/10  before Rx to 1/10 after Rx.  He should cont to benefit from cont skilled PT services to address ongoing goals and to restore PLOF.    Comorbidities psoriasis    PT Treatment/Interventions ADLs/Self Care Home Management;Cryotherapy;Electrical Stimulation;Ultrasound;Moist Heat;Therapeutic activities;Therapeutic exercise;Neuromuscular re-education;Manual techniques;Patient/family education;Passive range of motion;Dry needling    PT Next Visit Plan progress ther ex including scapular stabs.  Cont with manual techniques including jt mobs and PROM.    PT Home Exercise Plan Access Code: [email or web address]    Consulted and Agree with Plan of Care Patient             Patient will benefit from skilled therapeutic intervention in order to improve the following deficits and impairments:  Decreased range of motion, Decreased strength, Impaired flexibility, Postural dysfunction, Pain, Decreased activity tolerance  Visit Diagnosis: Acute pain of left shoulder  Muscle weakness (generalized)  Stiffness of left shoulder, not elsewhere classified     Problem List There are no problems to display for this patient.   Audie Clear III PT, DPT 09/28/20 9:53 PM   [facility] [address] [place], [place], [address] Phone: [phone]   Fax:  [phone]  Name: Timothy Swanson MRN: [id number] Date of Birth: 25-Dec-1970

## 2020-10-02 ENCOUNTER — Ambulatory Visit (HOSPITAL_BASED_OUTPATIENT_CLINIC_OR_DEPARTMENT_OTHER): Payer: 59 | Admitting: Physical Therapy

## 2020-10-02 ENCOUNTER — Other Ambulatory Visit: Payer: Self-pay

## 2020-10-02 ENCOUNTER — Encounter (HOSPITAL_BASED_OUTPATIENT_CLINIC_OR_DEPARTMENT_OTHER): Payer: Self-pay | Admitting: Physical Therapy

## 2020-10-02 DIAGNOSIS — M25612 Stiffness of left shoulder, not elsewhere classified: Secondary | ICD-10-CM

## 2020-10-02 DIAGNOSIS — M6281 Muscle weakness (generalized): Secondary | ICD-10-CM

## 2020-10-02 DIAGNOSIS — M25512 Pain in left shoulder: Secondary | ICD-10-CM | POA: Diagnosis not present

## 2020-10-02 NOTE — Therapy (Signed)
[facility] [address] [place], [place], [address] Phone: [phone]   Fax:  [phone]  Physical Therapy Treatment  Patient Details  Name: Timothy Swanson MRN: [id number] Date of Birth: Sep 11, 1970 Referring Provider (PT): Christena Deem, MD   Encounter Date: 10/02/2020   PT End of Session - 10/02/20 0839     Visit Number 5    Number of Visits 12    Date for PT Re-Evaluation 10/13/20    Authorization Type UHC    PT Start Time 0806    PT Stop Time 0849    PT Time Calculation (min) 43 min    Activity Tolerance Patient tolerated treatment well    Behavior During Therapy [facility] for tasks assessed/performed             Past Medical History:  Diagnosis Date   ADD (attention deficit disorder)    ED (erectile dysfunction)    Elevated LDL cholesterol level    Family history of diabetes mellitus (DM)    GERD (gastroesophageal reflux disease)    Hypertension    Low back strain    Low blood sugar    Malaise    Psoriasis    Tinea cruris     Past Surgical History:  Procedure Laterality Date   NO PAST SURGERIES      There were no vitals filed for this visit.   Subjective Assessment - 10/02/20 0810     Subjective Pt denies any adverse effects after prior Rx but did have some soreness later.  Pt reports compliance with HEP.  Pt has pain with driving.  Pt reports improved shoulder mobility and is able to reach higher on wall. Pt states if he has pain, he does some of his exercises and feels better.  Pt tries not to sit too long with computer work and is more aware of his posture.  He reports reduced pain with using computer.  Pt states he had times with no pain yesterday and typically he can feel some pain.    Pertinent History Psoriasis    Currently in Pain? Yes    Pain Score 1     Pain Location Shoulder    Pain Orientation Left                               [facility] Adult PT Treatment/Exercise -  10/02/20 0001       Neuro Re-ed    Neuro Re-ed Details  see flow sheet for ABC's, supine rhythmic stabs, standing ball rolls and serratus plus on wall for improved functional stability and kinesthetic awareness for daily activities and reaching      Exercises   Exercises Shoulder   Reviewed response to prior Rx, current function, HEP compiance, and pain level.     Shoulder Exercises: Supine   Other Supine Exercises supine Shoulder ABC x 1 rep iwith 1#, supine serratus punches 3x10 reps each with 2#, rhhytmic stabs at 90 deg flexion 3 sets of 30 sec      Shoulder Exercises: Prone   Other Prone Exercises Prone extension 2x10 with 1#, prone row 2x10 with 1#      Shoulder Exercises: Sidelying   Other Sidelying Exercises S/L ER 2 x 10 reps with 1#      Shoulder Exercises: Standing   Other Standing Exercises 4D ball rolls on wall at 90 deg flexion 2 x 10 reps each.  serratus plus on  wall 2x10 reps      Manual Therapy   Manual Therapy Joint mobilization;Passive ROM    Joint Mobilization Grade I-II PA and inferior jt mobs to L GH to improve pain, stiffness/ROM, and to normalize arthrokinematics    Passive ROM f/b L shoulder PROM in flex, abd, scaption, and ER                    PT Education - 10/02/20 1301     Education Details Instructed pt in correct form and to cont with HEP.    Person(s) Educated Patient    Methods Explanation;Demonstration;Verbal cues    Comprehension Returned demonstration;Verbalized understanding              PT Short Term Goals - 09/14/20 2357       PT SHORT TERM GOAL #1   Title Pt will be independent and compliant with HEP for improved pain, ROM, strength, and function.    Time 3    Period Weeks    Status New    Target Date 10/05/20      PT SHORT TERM GOAL #2   Title Pt will demo at least 10-15 deg of AROM in flexion, scaption, and abd for improved reaching and functional mobility.    Time 3    Period Weeks    Status New    Target  Date 10/05/20      PT SHORT TERM GOAL #3   Title Pt will report at least a 25% improvement in pain with driving and using computer    Time 3    Period Weeks    Status New    Target Date 10/05/20      PT SHORT TERM GOAL #4   Title Pt will be able to wash his hair without difficulty or significant pain.    Time 4    Period Weeks    Status New    Target Date 10/12/20               PT Long Term Goals - 09/14/20 2358       PT LONG TERM GOAL #1   Title Pt will demo improved L shoulder flex, scaption, and abd AROM to be w/n 5 deg of R shoulder for improved reaching overhead.    Time 6    Period Weeks    Status New    Target Date 10/26/20      PT LONG TERM GOAL #2   Title Pt will report he is able to perform his normal overhead activities including reaching into a cabinet without increased pain or difficulty.    Time 6    Period Weeks    Status New    Target Date 10/26/20      PT LONG TERM GOAL #3   Title Pt will be able to drive without significant pain.    Time 6    Period Weeks    Status New    Target Date 10/26/20      PT LONG TERM GOAL #4   Title Pt will demo 5/5 L shoulder strength t/o for functional carrying/lifting and to assist with returning to workout activities.    Time 6    Period Weeks    Status New    Target Date 10/26/20      PT LONG TERM GOAL #5   Title Pt will be able to perform his ADLs and IADLs without significant pain or difficulty    Time 6  Period Weeks    Status New    Target Date 10/26/20                   Plan - 10/02/20 1302     Clinical Impression Statement Pt is progressing well with shoulder mobility, pain, strength, and function.  Pt is more aware of posture and has reduced pain with using computer per subjective reports.  Pt states there were times yesterday when he had no pain.  Pt is improving with L shoulder and scapular strength and stability as evidenced by progression of exercises including neuro re-e  dactivities.  Pt performed ther ex and neuro re-ed activites well withouit c/o's.  Pt responded well to Rx having no increased pain after Rx.  He should cont to benefit from cont skilled PT services to address ongoing goals and to restore PLOF.    Comorbidities psoriasis    PT Treatment/Interventions ADLs/Self Care Home Management;Cryotherapy;Electrical Stimulation;Ultrasound;Moist Heat;Therapeutic activities;Therapeutic exercise;Neuromuscular re-education;Manual techniques;Patient/family education;Passive range of motion;Dry needling    PT Next Visit Plan progress ther ex and neuro re-ed including scapular stabs.  Cont with manual techniques including jt mobs and PROM.    PT Home Exercise Plan Access Code: [email or web address]    Consulted and Agree with Plan of Care Patient             Patient will benefit from skilled therapeutic intervention in order to improve the following deficits and impairments:  Decreased range of motion, Decreased strength, Impaired flexibility, Postural dysfunction, Pain, Decreased activity tolerance  Visit Diagnosis: Acute pain of left shoulder  Muscle weakness (generalized)  Stiffness of left shoulder, not elsewhere classified     Problem List There are no problems to display for this patient.   Audie Clear III PT, DPT 10/02/20 1:12 PM   [facility] [address] [place], [place], [address] Phone: [phone]   Fax:  [phone]  Name: Timothy Swanson MRN: [id number] Date of Birth: 1970/12/07

## 2020-10-05 ENCOUNTER — Other Ambulatory Visit: Payer: Self-pay

## 2020-10-05 ENCOUNTER — Encounter (HOSPITAL_BASED_OUTPATIENT_CLINIC_OR_DEPARTMENT_OTHER): Payer: Self-pay | Admitting: Physical Therapy

## 2020-10-05 ENCOUNTER — Ambulatory Visit (HOSPITAL_BASED_OUTPATIENT_CLINIC_OR_DEPARTMENT_OTHER): Payer: 59 | Admitting: Physical Therapy

## 2020-10-05 DIAGNOSIS — M25512 Pain in left shoulder: Secondary | ICD-10-CM | POA: Diagnosis not present

## 2020-10-05 DIAGNOSIS — M6281 Muscle weakness (generalized): Secondary | ICD-10-CM

## 2020-10-05 DIAGNOSIS — M25612 Stiffness of left shoulder, not elsewhere classified: Secondary | ICD-10-CM

## 2020-10-05 NOTE — Therapy (Signed)
[facility] [address] [place], [place], [address] Phone: [phone]   Fax:  [phone]  Physical Therapy Treatment  Patient Details  Name: Timothy Swanson MRN: [id number] Date of Birth: 02/20/1970 Referring Provider (PT): Christena Deem, MD   Encounter Date: 10/05/2020   PT End of Session - 10/05/20 0911     Visit Number 6    Number of Visits 12    Date for PT Re-Evaluation 10/13/20    Authorization Type UHC    PT Start Time 0901    PT Stop Time 0941    PT Time Calculation (min) 40 min    Activity Tolerance Patient tolerated treatment well    Behavior During Therapy [facility] for tasks assessed/performed             Past Medical History:  Diagnosis Date   ADD (attention deficit disorder)    ED (erectile dysfunction)    Elevated LDL cholesterol level    Family history of diabetes mellitus (DM)    GERD (gastroesophageal reflux disease)    Hypertension    Low back strain    Low blood sugar    Malaise    Psoriasis    Tinea cruris     Past Surgical History:  Procedure Laterality Date   NO PAST SURGERIES      There were no vitals filed for this visit.   Subjective Assessment - 10/05/20 0910     Subjective Pt reports he was sore after prior Rx though had no adverse effects.  Pt denies pain currently.  Pt states he may be doing some yard work today including putting out mulch.  Pt states his wife may come in next Rx to show her how to perform PROM.    Currently in Pain? No/denies                               [facility] Adult PT Treatment/Exercise - 10/05/20 0001       Neuro Re-ed    Neuro Re-ed Details  see flow sheet for ABC's, supine rhythmic stabs, standing ball rolls and serratus plus on wall for improved functional stability and kinesthetic awareness for daily activities and reaching      Exercises   Exercises Shoulder   Reviewed response to prior Rx, current function, HEP compiance,  and pain level.     Shoulder Exercises: Supine   Other Supine Exercises supine Shoulder ABC x 1 rep with 2#, supine serratus punches 1x15 with  2x10 reps each with 3#, rhythmic stabs at 60/90/120 deg flexion 1 sets of 30 sec each      Shoulder Exercises: Prone   Other Prone Exercises Prone extension 2x10 with 1#, prone row 1x10 each with 1# and 2#      Shoulder Exercises: Sidelying   Other Sidelying Exercises S/L ER 2 x 10 reps with 1#      Shoulder Exercises: Standing   Other Standing Exercises 4D ball rolls on wall at 90 deg flexion 2 x 10 reps each.  serratus plus on wall 3x10 reps      Shoulder Exercises: ROM/Strengthening   UBE (Upper Arm Bike) Sci fit x 3 mins at L1      Manual Therapy   Manual Therapy Joint mobilization;Passive ROM    Joint Mobilization Grade I-II PA and inferior jt mobs to L GH to improve pain, stiffness/ROM, and to normalize arthrokinematics    Passive ROM  f/b L shoulder PROM in flex, abd, scaption, and ER                    PT Education - 10/05/20 2217     Education Details Educated pt concerning POC.  Instructed pt to cont with HEP.  Instructed pt in correct form with exercises.    Person(s) Educated Patient    Methods Explanation;Demonstration;Verbal cues    Comprehension Returned demonstration;Verbalized understanding              PT Short Term Goals - 09/14/20 2357       PT SHORT TERM GOAL #1   Title Pt will be independent and compliant with HEP for improved pain, ROM, strength, and function.    Time 3    Period Weeks    Status New    Target Date 10/05/20      PT SHORT TERM GOAL #2   Title Pt will demo at least 10-15 deg of AROM in flexion, scaption, and abd for improved reaching and functional mobility.    Time 3    Period Weeks    Status New    Target Date 10/05/20      PT SHORT TERM GOAL #3   Title Pt will report at least a 25% improvement in pain with driving and using computer    Time 3    Period Weeks    Status  New    Target Date 10/05/20      PT SHORT TERM GOAL #4   Title Pt will be able to wash his hair without difficulty or significant pain.    Time 4    Period Weeks    Status New    Target Date 10/12/20               PT Long Term Goals - 09/14/20 2358       PT LONG TERM GOAL #1   Title Pt will demo improved L shoulder flex, scaption, and abd AROM to be w/n 5 deg of R shoulder for improved reaching overhead.    Time 6    Period Weeks    Status New    Target Date 10/26/20      PT LONG TERM GOAL #2   Title Pt will report he is able to perform his normal overhead activities including reaching into a cabinet without increased pain or difficulty.    Time 6    Period Weeks    Status New    Target Date 10/26/20      PT LONG TERM GOAL #3   Title Pt will be able to drive without significant pain.    Time 6    Period Weeks    Status New    Target Date 10/26/20      PT LONG TERM GOAL #4   Title Pt will demo 5/5 L shoulder strength t/o for functional carrying/lifting and to assist with returning to workout activities.    Time 6    Period Weeks    Status New    Target Date 10/26/20      PT LONG TERM GOAL #5   Title Pt will be able to perform his ADLs and IADLs without significant pain or difficulty    Time 6    Period Weeks    Status New    Target Date 10/26/20                   Plan - 10/05/20  2218     Clinical Impression Statement Pt presents to Rx denying pain today.  He is progressing well with pain, strength, sx's, and function.  He demonstrates improved strength and stability as evidenced by performance and progression of exercises.  Pt responded well to Rx reporting no pain after Rx.  He should cont to benefit from cont skilled PT services to address ongoing goals and restore PLOF.    Comorbidities psoriasis    PT Treatment/Interventions ADLs/Self Care Home Management;Cryotherapy;Electrical Stimulation;Ultrasound;Moist Heat;Therapeutic  activities;Therapeutic exercise;Neuromuscular re-education;Manual techniques;Patient/family education;Passive range of motion;Dry needling    PT Next Visit Plan progress ther ex and neuro re-ed including scapular stabs.  Cont with manual techniques including jt mobs and PROM.  Pt likes the stretching/PROM.  Instruct pt's in wife how to perform PROM if she comes next Rx.    PT Home Exercise Plan Access Code: [email or web address]    Consulted and Agree with Plan of Care Patient             Patient will benefit from skilled therapeutic intervention in order to improve the following deficits and impairments:  Decreased range of motion, Decreased strength, Impaired flexibility, Postural dysfunction, Pain, Decreased activity tolerance  Visit Diagnosis: Acute pain of left shoulder  Muscle weakness (generalized)  Stiffness of left shoulder, not elsewhere classified     Problem List There are no problems to display for this patient.  Audie Clear III PT, DPT 10/05/20 10:24 PM   [facility] [address] [place], [place], [address] Phone: [phone]   Fax:  [phone]  Name: Timothy Swanson MRN: [id number] Date of Birth: 1970-10-08

## 2020-10-09 ENCOUNTER — Encounter (HOSPITAL_BASED_OUTPATIENT_CLINIC_OR_DEPARTMENT_OTHER): Payer: Self-pay | Admitting: Physical Therapy

## 2020-10-09 ENCOUNTER — Other Ambulatory Visit: Payer: Self-pay

## 2020-10-09 ENCOUNTER — Ambulatory Visit (HOSPITAL_BASED_OUTPATIENT_CLINIC_OR_DEPARTMENT_OTHER): Payer: 59 | Attending: Sports Medicine | Admitting: Physical Therapy

## 2020-10-09 DIAGNOSIS — M25612 Stiffness of left shoulder, not elsewhere classified: Secondary | ICD-10-CM | POA: Insufficient documentation

## 2020-10-09 DIAGNOSIS — M6281 Muscle weakness (generalized): Secondary | ICD-10-CM | POA: Diagnosis present

## 2020-10-09 DIAGNOSIS — M25512 Pain in left shoulder: Secondary | ICD-10-CM | POA: Insufficient documentation

## 2020-10-09 NOTE — Therapy (Signed)
[facility] [address] [place], [place], [address] Phone: [phone]   Fax:  [phone]  Physical Therapy Treatment  Patient Details  Name: Timothy Swanson MRN: [id number] Date of Birth: 04-11-1970 Referring Provider (PT): Christena Deem, MD   Encounter Date: 10/09/2020   PT End of Session - 10/09/20 0906     Visit Number 7    Number of Visits 12    Date for PT Re-Evaluation 10/13/20    Authorization Type UHC    Progress Note Due on Visit --   10/13/20   PT Start Time 0810    PT Stop Time 0855    PT Time Calculation (min) 45 min    Activity Tolerance Patient tolerated treatment well    Behavior During Therapy [facility] for tasks assessed/performed             Past Medical History:  Diagnosis Date   ADD (attention deficit disorder)    ED (erectile dysfunction)    Elevated LDL cholesterol level    Family history of diabetes mellitus (DM)    GERD (gastroesophageal reflux disease)    Hypertension    Low back strain    Low blood sugar    Malaise    Psoriasis    Tinea cruris     Past Surgical History:  Procedure Laterality Date   NO PAST SURGERIES      There were no vitals filed for this visit.   Subjective Assessment - 10/09/20 0813     Subjective Pt states he was able to mulch his yard without increased pain.  He cut grass yesterday and reports having some pain and thinks he is sore from that today.  He reports .5/10 L shoulder pain currently.  Pt reports he is improving.  Pt's wife states that pt is able to sleep better.  Pt denies any adverse effects after prior Rx.    Patient is accompained by: Family member   wife   Pertinent History Psoriasis    Currently in Pain? Yes    Pain Score --   .5/10   Pain Location Shoulder    Pain Orientation Left                               [facility] Adult PT Treatment/Exercise - 10/09/20 0001       Exercises   Exercises Shoulder   Reviewed response to  prior Rx, current function, HEP compiance, and pain level.     Shoulder Exercises: Supine   Other Supine Exercises supine Shoulder ABC x 1 rep with 2#, supine serratus punches 2x10 reps each with 3#, rhythmic stabs at 60/90/120 deg flexion 1 set of 30 sec each      Shoulder Exercises: Prone   Other Prone Exercises Prone extension 2x10 with 1#, prone row 2x10 with 2#, prone horizontal abduction 2x10      Shoulder Exercises: Sidelying   Other Sidelying Exercises S/L ER 2 x 10 reps with 1#      Shoulder Exercises: Standing   Other Standing Exercises 4D ball rolls on wall at 90 deg flexion 2 x 10 reps each.  serratus plus on wall 2 x10 reps      Manual Therapy   Manual Therapy Joint mobilization;Passive ROM    Joint Mobilization Grade II PA and inferior jt mobs to L GH to improve pain, stiffness/ROM, and to normalize arthrokinematics    Passive ROM f/b L  shoulder PROM in flex, scaption, and ER.  Instructed pt's wife in performing PROM in flexion, scaption, and ER and Pt's wife performed in the clinic                    PT Education - 10/09/20 0904     Education Details Instructed pt's wife in how to perform PROM on pt's shoulder including appropriate range and hand positioning.  Pt's wife performed PROM and PT gave pt and pt's wife instruction and cuing.  Educated pt and wife in how it should feel and instructed Pt to let his wife know how it feels.  Educated pt in POC.  Reviewed HEP.    Person(s) Educated Patient;Spouse    Methods Explanation;Demonstration;Verbal cues    Comprehension Verbalized understanding;Returned demonstration              PT Short Term Goals - 09/14/20 2357       PT SHORT TERM GOAL #1   Title Pt will be independent and compliant with HEP for improved pain, ROM, strength, and function.    Time 3    Period Weeks    Status New    Target Date 10/05/20      PT SHORT TERM GOAL #2   Title Pt will demo at least 10-15 deg of AROM in flexion, scaption,  and abd for improved reaching and functional mobility.    Time 3    Period Weeks    Status New    Target Date 10/05/20      PT SHORT TERM GOAL #3   Title Pt will report at least a 25% improvement in pain with driving and using computer    Time 3    Period Weeks    Status New    Target Date 10/05/20      PT SHORT TERM GOAL #4   Title Pt will be able to wash his hair without difficulty or significant pain.    Time 4    Period Weeks    Status New    Target Date 10/12/20               PT Long Term Goals - 09/14/20 2358       PT LONG TERM GOAL #1   Title Pt will demo improved L shoulder flex, scaption, and abd AROM to be w/n 5 deg of R shoulder for improved reaching overhead.    Time 6    Period Weeks    Status New    Target Date 10/26/20      PT LONG TERM GOAL #2   Title Pt will report he is able to perform his normal overhead activities including reaching into a cabinet without increased pain or difficulty.    Time 6    Period Weeks    Status New    Target Date 10/26/20      PT LONG TERM GOAL #3   Title Pt will be able to drive without significant pain.    Time 6    Period Weeks    Status New    Target Date 10/26/20      PT LONG TERM GOAL #4   Title Pt will demo 5/5 L shoulder strength t/o for functional carrying/lifting and to assist with returning to workout activities.    Time 6    Period Weeks    Status New    Target Date 10/26/20      PT LONG TERM GOAL #5  Title Pt will be able to perform his ADLs and IADLs without significant pain or difficulty    Time 6    Period Weeks    Status New    Target Date 10/26/20                   Plan - 10/09/20 0907     Clinical Impression Statement Pt is making great progress with pain, sx's, strength, and function.  Pt was able to lay mulch down without increased pain though did have some pain with mowing lawn.  Pt demonstrates improved shoulder and scapular strength as evidenced by performance and  progression of exercises.  Pt demonstrated improved form with S/L ER.  pt able to perform prone hz abd without pain.  PT taught Pt's wife how to perform shoulder PROM.  Instructed Pt's wife in hand positioning, form, and appropriate ROM.  Pt's wife demonstrated good understanding with PROM and performed PROM well.  Pt responded well to Rx reporting improved pain from .5/10 before R x to 0/10 after Rx.  Pt should cont to benefit from cont skilled PT services to address ongoing goals and to restore PLOF.    Comorbidities psoriasis    PT Treatment/Interventions ADLs/Self Care Home Management;Cryotherapy;Electrical Stimulation;Ultrasound;Moist Heat;Therapeutic activities;Therapeutic exercise;Neuromuscular re-education;Manual techniques;Patient/family education;Passive range of motion;Dry needling    PT Next Visit Plan PN/Recert next visit.  progress ther ex and neuro re-ed including scapular stabs.  Cont with manual techniques including jt mobs and PROM.    PT Home Exercise Plan Access Code: [email or web address]    Consulted and Agree with Plan of Care Patient             Patient will benefit from skilled therapeutic intervention in order to improve the following deficits and impairments:  Decreased range of motion, Decreased strength, Impaired flexibility, Postural dysfunction, Pain, Decreased activity tolerance  Visit Diagnosis: Acute pain of left shoulder  Muscle weakness (generalized)  Stiffness of left shoulder, not elsewhere classified     Problem List There are no problems to display for this patient.   Audie Clear III PT, DPT 10/09/20 9:20 AM   [facility] [address] [place], [place], [address] Phone: [phone]   Fax:  [phone]  Name: Timothy Swanson MRN: [id number] Date of Birth: January 28, 1971

## 2020-10-15 ENCOUNTER — Other Ambulatory Visit: Payer: Self-pay

## 2020-10-15 ENCOUNTER — Ambulatory Visit (HOSPITAL_BASED_OUTPATIENT_CLINIC_OR_DEPARTMENT_OTHER): Payer: 59 | Admitting: Physical Therapy

## 2020-10-15 DIAGNOSIS — M6281 Muscle weakness (generalized): Secondary | ICD-10-CM

## 2020-10-15 DIAGNOSIS — M25512 Pain in left shoulder: Secondary | ICD-10-CM | POA: Diagnosis not present

## 2020-10-15 DIAGNOSIS — M25612 Stiffness of left shoulder, not elsewhere classified: Secondary | ICD-10-CM

## 2020-10-15 NOTE — Therapy (Signed)
[facility] [address] [place], [place], [address] Phone: [phone]   Fax:  [phone]  Physical Therapy Treatment/Progress Note  Patient Details  Name: Timothy Swanson MRN: [id number] Date of Birth: 1971/01/30 Referring Provider (PT): Inez Catalina, MD   Encounter Date: 10/15/2020   PT End of Session - 10/15/20 0935     Visit Number 8    Number of Visits 14    Date for PT Re-Evaluation 11/05/20    Authorization Type UHC    Progress Note Due on Visit --   11/05/20   PT Start Time 0848    PT Stop Time 0933    PT Time Calculation (min) 45 min    Activity Tolerance Patient tolerated treatment well    Behavior During Therapy [facility] for tasks assessed/performed             Past Medical History:  Diagnosis Date   ADD (attention deficit disorder)    ED (erectile dysfunction)    Elevated LDL cholesterol level    Family history of diabetes mellitus (DM)    GERD (gastroesophageal reflux disease)    Hypertension    Low back strain    Low blood sugar    Malaise    Psoriasis    Tinea cruris     Past Surgical History:  Procedure Laterality Date   NO PAST SURGERIES      There were no vitals filed for this visit.   Subjective Assessment - 10/15/20 0851     Subjective Pt reports improved sx's, pain, and function though has weakness.  Pt reports improved pain with driving.  He is focusing more on good posture with sitting and is not sitting as long.  He has improved pain with sitting and using the computer though does have pain with prolonged sitting.  Pt was able to mulch his yard without increased pain.  Pt had pain with mowing his lawn though able to mow his lawn this week with less pain.  Pt reports improved cervical ROM.  Pt able to sleep better and turn on L side.  Pt denies any adverse effects after prior Rx.  Pt reports his L shoulder is at 80-85%.  Pt reports improved pain with bathing and washing hair though  reports having weakness.  Pt denies having pain with applying wt thru L UE with transfers though does have weakness.    Pertinent History Psoriasis    Diagnostic tests US--Pt states US showed no tears and he thinks MD informed he had RTC tendinitis.    Pain Score 0-No pain   Worst Pain:  2/10               [facility] PT Assessment - 10/15/20 0001       Observation/Other Assessments   Upper Extremity Functional Index  64/80      AROM   AROM Assessment Site Shoulder    Left Shoulder Flexion 147 Degrees    Left Shoulder ABduction 132 Degrees   Scaption:  146 deg   Left Shoulder Internal Rotation 70 Degrees    Left Shoulder External Rotation 82 Degrees      Strength   Left Shoulder Flexion 4+/5    Left Shoulder ABduction 4/5   Scaption:  4+/5   Left Shoulder Internal Rotation 5/5    Left Shoulder External Rotation 5/5      Palpation   Palpation comment TTP:  anterior L shoulder  [facility] Adult PT Treatment/Exercise - 10/15/20 0001       Exercises   Exercises Shoulder   Reviewed response to prior Rx, current function, reported functional progress/deficits, HEP compiance, and pain levels.  Assessed ROM and strength.  Pt completed UEFI.     Shoulder Exercises: Supine   Other Supine Exercises supine Shoulder ABC x 1 rep with 2#      Shoulder Exercises: Prone   Other Prone Exercises Prone extension 2x10 with 2#, prone row 2x10 with 3#, prone horizontal abduction 2x10      Shoulder Exercises: Standing   Other Standing Exercises 4D ball rolls on wall at 90 deg flexion 2 x 10 reps each.  serratus plus on wall 2 x10 reps                     PT Education - 10/15/20 1054     Education Details Educated pt in objective findings and compared them to the previous findings.  Educated pt concerning goal progress and POC.    Person(s) Educated Patient    Methods Explanation    Comprehension Verbalized understanding               PT Short Term Goals - 10/15/20 0910       PT SHORT TERM GOAL #1   Title Pt will be independent and compliant with HEP for improved pain, ROM, strength, and function.    Time 3    Period Weeks    Status Achieved    Target Date 10/05/20      PT SHORT TERM GOAL #2   Title Pt will demo at least 10-15 deg of AROM in flexion, scaption, and abd for improved reaching and functional mobility.    Time 3    Period Weeks    Status Achieved    Target Date 10/05/20      PT SHORT TERM GOAL #3   Title Pt will report at least a 25% improvement in pain with driving and using computer    Baseline Pt repors 70-75% improvement    Time 3    Status Achieved    Target Date 10/05/20      PT SHORT TERM GOAL #4   Title Pt will be able to wash his hair without difficulty or significant pain.    Time 4    Period Weeks    Status Achieved    Target Date 10/12/20               PT Long Term Goals - 10/15/20 0911       PT LONG TERM GOAL #1   Title Pt will demo improved L shoulder flex, scaption, and abd AROM to be w/n 5 deg of R shoulder for improved reaching overhead.    Time 3    Period Weeks    Status Partially Met    Target Date 11/05/20      PT LONG TERM GOAL #2   Title Pt will report he is able to perform his normal overhead activities including reaching into a cabinet without increased pain or difficulty.    Time 3    Period Weeks    Status Partially Met    Target Date 11/05/20      PT LONG TERM GOAL #3   Title Pt will be able to drive without significant pain.    Time 6    Period Weeks    Status Achieved    Target Date 10/26/20  PT LONG TERM GOAL #4   Title Pt will demo 5/5 L shoulder strength t/o for functional carrying/lifting and to assist with returning to workout activities.    Time 3    Period Weeks    Status Partially Met    Target Date 11/05/20      PT LONG TERM GOAL #5   Title Pt will be able to perform his ADLs and IADLs without significant pain or difficulty     Time 3    Period Weeks    Status Partially Met    Target Date 11/05/20                   Plan - 10/15/20 0911     Clinical Impression Statement Pt is making excellent progress in PT with ROM, strength, pain and sx's, and function.  He is able to perform his ADLs and IADLs including bathing, washing hair, driving, and reaching with much reduced pain and improved ease.  He does still have some difficulty with certain activities and c/o's of weakness. He presents to Rx without pain and reports improved worst pain from 8/10 initially to 2/10 currently.  Pt demonstrates improved tolerance to activity as evidenced by performance of yard work activities.  Pt is able to use his computer with improved pain also.  Pt is able to reach overhead much easier with improved ROM and improved mechanics.  Pt is improving with strength and stability t/o L shoulder though does continue to have some weakness in L shoulder.  Pt is compliant with HEP.  Pt demonstrates clinically significant improvement with UEFI improving from 43/80 initially to 64/80 currently.Pt has met all STG's and LTG#3 and has partially met other LTG's.  Pt should benefit from cont skilled PT services to address ongoing goals and to restore PLOF.    Comorbidities psoriasis    Rehab Potential Good    PT Frequency 2x / week    PT Duration 3 weeks    PT Treatment/Interventions ADLs/Self Care Home Management;Cryotherapy;Electrical Stimulation;Ultrasound;Moist Heat;Therapeutic activities;Therapeutic exercise;Neuromuscular re-education;Manual techniques;Patient/family education;Passive range of motion;Dry needling    PT Next Visit Plan progress ther ex and neuro re-ed including scapular stabs.  Add lateral band walks on wall.  Cont with manual techniques including jt mobs and PROM.    PT Home Exercise Plan Access Code: [email or web address]    Consulted and Agree with Plan of Care Patient             Patient will benefit from skilled therapeutic  intervention in order to improve the following deficits and impairments:  Decreased range of motion, Decreased strength, Impaired flexibility, Postural dysfunction, Pain, Decreased activity tolerance  Visit Diagnosis: Acute pain of left shoulder  Muscle weakness (generalized)  Stiffness of left shoulder, not elsewhere classified     Problem List There are no problems to display for this patient.   Selinda Michaels III PT, DPT 10/15/20 11:00 AM   [place], [place], [address] Phone: [phone]   Fax:  [phone]  Name: Timothy Swanson MRN: [id number] Date of Birth: 1970-09-25

## 2020-10-19 ENCOUNTER — Ambulatory Visit (HOSPITAL_BASED_OUTPATIENT_CLINIC_OR_DEPARTMENT_OTHER): Payer: 59 | Admitting: Physical Therapy

## 2020-10-19 ENCOUNTER — Other Ambulatory Visit: Payer: Self-pay

## 2020-10-19 ENCOUNTER — Encounter (HOSPITAL_BASED_OUTPATIENT_CLINIC_OR_DEPARTMENT_OTHER): Payer: Self-pay | Admitting: Physical Therapy

## 2020-10-19 DIAGNOSIS — M25512 Pain in left shoulder: Secondary | ICD-10-CM

## 2020-10-19 DIAGNOSIS — M6281 Muscle weakness (generalized): Secondary | ICD-10-CM

## 2020-10-19 DIAGNOSIS — M25612 Stiffness of left shoulder, not elsewhere classified: Secondary | ICD-10-CM

## 2020-10-19 NOTE — Therapy (Signed)
[facility] [address] [place], [place], [address] Phone: [phone]   Fax:  [phone]  Physical Therapy Treatment  Patient Details  Name: Timothy Swanson MRN: [id number] Date of Birth: March 30, 1970 Referring Provider (PT): Inez Catalina, MD   Encounter Date: 10/19/2020   PT End of Session - 10/19/20 0931     Visit Number 9    Number of Visits 14    Date for PT Re-Evaluation 11/05/20    Authorization Type UHC    PT Start Time 0850    PT Stop Time 0930    PT Time Calculation (min) 40 min    Activity Tolerance Patient tolerated treatment well    Behavior During Therapy [facility] for tasks assessed/performed             Past Medical History:  Diagnosis Date   ADD (attention deficit disorder)    ED (erectile dysfunction)    Elevated LDL cholesterol level    Family history of diabetes mellitus (DM)    GERD (gastroesophageal reflux disease)    Hypertension    Low back strain    Low blood sugar    Malaise    Psoriasis    Tinea cruris     Past Surgical History:  Procedure Laterality Date   NO PAST SURGERIES      There were no vitals filed for this visit.   Subjective Assessment - 10/19/20 0852     Subjective Pt states he felt good after prior Rx.  His back is stiff/sore from sitting and using the computer.  His shoulder was a little sore from sitting and using the computer yesterday.  He states he doesn't really feel anything in his arm now, just sore soreness and tension in his L UT.    Pertinent History Psoriasis    Currently in Pain? Yes    Pain Score --   .5-1/10   Pain Location Shoulder    Pain Orientation Left                               [facility] Adult PT Treatment/Exercise - 10/19/20 0001       Exercises   Exercises Shoulder   Reviewed response to prior Rx, current function, reported functional progress/deficits, HEP compiance, and pain levels.     Shoulder Exercises: Supine    Other Supine Exercises supine Shoulder ABC x 1 rep with 2#, supine serratus punches 2x10 reps each with 3#      Shoulder Exercises: Seated   Other Seated Exercises seated UT stretch 3 reps of 20-30 sec hold bilat      Shoulder Exercises: Prone   Other Prone Exercises Prone extension 2x10 with 2#, prone row 2x10 with 3#, prone horizontal abductionwith 0/1# x10 each      Shoulder Exercises: Sidelying   Other Sidelying Exercises S/L ER x 12 reps with 1#, x12 reps with 2#      Shoulder Exercises: Standing   Other Standing Exercises 4D ball rolls on wall at 90 deg flexion 2 x 15 reps each.  serratus plus on wall 2 x10 reps, wall walks with YTB 2x 5 reps      Manual Therapy   Manual Therapy Joint mobilization;Passive ROM    Joint Mobilization Grade II PA and inferior jt mobs to L GH to improve pain, stiffness/ROM, and to normalize arthrokinematics    Passive ROM f/b L shoulder PROM in flex, abd, and  ER to improve mobility                     PT Education - 10/19/20 1319     Education Details Educated pt in correct form with exercises.  Instructed pt to cont with HEP.    Person(s) Educated Patient    Methods Explanation;Demonstration;Verbal cues    Comprehension Verbalized understanding;Returned demonstration              PT Short Term Goals - 10/15/20 0910       PT SHORT TERM GOAL #1   Title Pt will be independent and compliant with HEP for improved pain, ROM, strength, and function.    Time 3    Period Weeks    Status Achieved    Target Date 10/05/20      PT SHORT TERM GOAL #2   Title Pt will demo at least 10-15 deg of AROM in flexion, scaption, and abd for improved reaching and functional mobility.    Time 3    Period Weeks    Status Achieved    Target Date 10/05/20      PT SHORT TERM GOAL #3   Title Pt will report at least a 25% improvement in pain with driving and using computer    Baseline Pt repors 70-75% improvement    Time 3    Status Achieved     Target Date 10/05/20      PT SHORT TERM GOAL #4   Title Pt will be able to wash his hair without difficulty or significant pain.    Time 4    Period Weeks    Status Achieved    Target Date 10/12/20               PT Long Term Goals - 10/15/20 0911       PT LONG TERM GOAL #1   Title Pt will demo improved L shoulder flex, scaption, and abd AROM to be w/n 5 deg of R shoulder for improved reaching overhead.    Time 3    Period Weeks    Status Partially Met    Target Date 11/05/20      PT LONG TERM GOAL #2   Title Pt will report he is able to perform his normal overhead activities including reaching into a cabinet without increased pain or difficulty.    Time 3    Period Weeks    Status Partially Met    Target Date 11/05/20      PT LONG TERM GOAL #3   Title Pt will be able to drive without significant pain.    Time 6    Period Weeks    Status Achieved    Target Date 10/26/20      PT LONG TERM GOAL #4   Title Pt will demo 5/5 L shoulder strength t/o for functional carrying/lifting and to assist with returning to workout activities.    Time 3    Period Weeks    Status Partially Met    Target Date 11/05/20      PT LONG TERM GOAL #5   Title Pt will be able to perform his ADLs and IADLs without significant pain or difficulty    Time 3    Period Weeks    Status Partially Met    Target Date 11/05/20                   Plan - 10/19/20 1320  Clinical Impression Statement Pt is making excellent progress in PT.  He reports his shoulder is not really hurting much anymore, but he has some soreness in UT.  PT instructed pt in UT stretching and pt reports feeling tighter on L UT.  Pt continues to have reduced pain overall and with activity.  Pt performed ther ex and neuro re-ed well with cuing for correct form and positioning.  Progressed exercises with increasing wt with S/L ER, adding wt to prone hz abd, and adding standing T band wall walks.  He responded well to Rx  stating he felt better after Rx than before Rx.  Pt should cont to benefit from cont skilled PT services to address ongoing goals and to restore PLOF.    Comorbidities psoriasis    PT Treatment/Interventions ADLs/Self Care Home Management;Cryotherapy;Electrical Stimulation;Ultrasound;Moist Heat;Therapeutic activities;Therapeutic exercise;Neuromuscular re-education;Manual techniques;Patient/family education;Passive range of motion;Dry needling    PT Next Visit Plan progress ther ex and neuro re-ed including scapular stabs.  Cont with manual techniques including jt mobs and PROM.    PT Home Exercise Plan Access Code: [email or web address]    Consulted and Agree with Plan of Care Patient             Patient will benefit from skilled therapeutic intervention in order to improve the following deficits and impairments:  Decreased range of motion, Decreased strength, Impaired flexibility, Postural dysfunction, Pain, Decreased activity tolerance  Visit Diagnosis: Acute pain of left shoulder  Muscle weakness (generalized)  Stiffness of left shoulder, not elsewhere classified     Problem List There are no problems to display for this patient.   Selinda Michaels III PT, DPT 10/19/20 1:28 PM   [facility] [address] [place], [place], [address] Phone: [phone]   Fax:  [phone]  Name: Timothy Swanson MRN: [id number] Date of Birth: Mar 23, 1970

## 2020-10-23 ENCOUNTER — Ambulatory Visit (HOSPITAL_BASED_OUTPATIENT_CLINIC_OR_DEPARTMENT_OTHER): Payer: 59 | Admitting: Physical Therapy

## 2020-10-26 ENCOUNTER — Other Ambulatory Visit: Payer: Self-pay

## 2020-10-26 ENCOUNTER — Encounter (HOSPITAL_BASED_OUTPATIENT_CLINIC_OR_DEPARTMENT_OTHER): Payer: Self-pay | Admitting: Physical Therapy

## 2020-10-26 ENCOUNTER — Ambulatory Visit (HOSPITAL_BASED_OUTPATIENT_CLINIC_OR_DEPARTMENT_OTHER): Payer: 59 | Admitting: Physical Therapy

## 2020-10-26 DIAGNOSIS — M25512 Pain in left shoulder: Secondary | ICD-10-CM

## 2020-10-26 DIAGNOSIS — M6281 Muscle weakness (generalized): Secondary | ICD-10-CM

## 2020-10-26 DIAGNOSIS — M25612 Stiffness of left shoulder, not elsewhere classified: Secondary | ICD-10-CM

## 2020-10-26 NOTE — Therapy (Signed)
[facility] [address] [place], [place], [address] Phone: [phone]   Fax:  [phone]  Physical Therapy Treatment  Patient Details  Name: Timothy Swanson MRN: [id number] Date of Birth: May 03, 1970 Referring Provider (PT): Inez Catalina, MD   Encounter Date: 10/26/2020   PT End of Session - 10/26/20 0932     Visit Number 10    Number of Visits 14    Date for PT Re-Evaluation 11/05/20    Authorization Type UHC    PT Start Time 0852    PT Stop Time 0935    PT Time Calculation (min) 43 min    Activity Tolerance Patient tolerated treatment well    Behavior During Therapy [facility] for tasks assessed/performed             Past Medical History:  Diagnosis Date   ADD (attention deficit disorder)    ED (erectile dysfunction)    Elevated LDL cholesterol level    Family history of diabetes mellitus (DM)    GERD (gastroesophageal reflux disease)    Hypertension    Low back strain    Low blood sugar    Malaise    Psoriasis    Tinea cruris     Past Surgical History:  Procedure Laterality Date   NO PAST SURGERIES      There were no vitals filed for this visit.   Subjective Assessment - 10/26/20 0853     Subjective Pt states he is a little sore today.  He didn't do his exercises as much recently and trimmed the hedges for exercise.  Pt reports he was sore from trimming the hedges but not painful.    Currently in Pain? No/denies    Pain Score 0-No pain                               [facility] Adult PT Treatment/Exercise - 10/26/20 0001       Exercises   Exercises Shoulder   Reviewed response to prior Rx, current function, reported functional progress/deficits, HEP compiance, and pain levels.     Shoulder Exercises: Supine   Other Supine Exercises supine rhythmic stabilization at 90/60/120 deg x 1 min each      Shoulder Exercises: Seated   Other Seated Exercises seated UT stretch 3 reps of 20-30  sec hold bilat      Shoulder Exercises: Prone   Other Prone Exercises Prone extension 2x10 with 2#, prone row 2x10 with 3#, prone horizontal abductionwith 1# 2 x10 each      Shoulder Exercises: Sidelying   Other Sidelying Exercises S/L ER with 2# 2 x 12 reps      Shoulder Exercises: Standing   Other Standing Exercises 4D ball rolls on wall at 90 deg flexion 2 x 15 reps each.  serratus plus on wall 2 x10 reps, wall walks with YTB 3x 5 reps      Manual Therapy   Manual Therapy Joint mobilization;Passive ROM    Joint Mobilization Grade II PA and inferior jt mobs to L GH to improve pain, stiffness/ROM, and to normalize arthrokinematics    Passive ROM f/b L shoulder PROM in flex, abd, and ER to improve mobility                     PT Education - 10/26/20 2251     Education Details Educated pt in correct form with exercises. Instructed pt to  cont with HEP.    Person(s) Educated Patient    Methods Explanation;Demonstration;Verbal cues    Comprehension Verbalized understanding;Returned demonstration              PT Short Term Goals - 10/15/20 0910       PT SHORT TERM GOAL #1   Title Pt will be independent and compliant with HEP for improved pain, ROM, strength, and function.    Time 3    Period Weeks    Status Achieved    Target Date 10/05/20      PT SHORT TERM GOAL #2   Title Pt will demo at least 10-15 deg of AROM in flexion, scaption, and abd for improved reaching and functional mobility.    Time 3    Period Weeks    Status Achieved    Target Date 10/05/20      PT SHORT TERM GOAL #3   Title Pt will report at least a 25% improvement in pain with driving and using computer    Baseline Pt repors 70-75% improvement    Time 3    Status Achieved    Target Date 10/05/20      PT SHORT TERM GOAL #4   Title Pt will be able to wash his hair without difficulty or significant pain.    Time 4    Period Weeks    Status Achieved    Target Date 10/12/20                PT Long Term Goals - 10/15/20 0911       PT LONG TERM GOAL #1   Title Pt will demo improved L shoulder flex, scaption, and abd AROM to be w/n 5 deg of R shoulder for improved reaching overhead.    Time 3    Period Weeks    Status Partially Met    Target Date 11/05/20      PT LONG TERM GOAL #2   Title Pt will report he is able to perform his normal overhead activities including reaching into a cabinet without increased pain or difficulty.    Time 3    Period Weeks    Status Partially Met    Target Date 11/05/20      PT LONG TERM GOAL #3   Title Pt will be able to drive without significant pain.    Time 6    Period Weeks    Status Achieved    Target Date 10/26/20      PT LONG TERM GOAL #4   Title Pt will demo 5/5 L shoulder strength t/o for functional carrying/lifting and to assist with returning to workout activities.    Time 3    Period Weeks    Status Partially Met    Target Date 11/05/20      PT LONG TERM GOAL #5   Title Pt will be able to perform his ADLs and IADLs without significant pain or difficulty    Time 3    Period Weeks    Status Partially Met    Target Date 11/05/20                   Plan - 10/26/20 2252     Clinical Impression Statement Pt continues to make excellent progress in PT.  Pt continues to have reduced pain overall and with activity. Pt demonstrates improved strength and stability t/o L shoulder and scapla as evidenced by performance and progression of ther ex and neuro  re-ed exercises.  He responded well to Rx reporting n opain after Rx.  Pt should cont to benefit from cont skilled PT services to address ongoing goals and to restore PLOF.    Comorbidities psoriasis    PT Treatment/Interventions ADLs/Self Care Home Management;Cryotherapy;Electrical Stimulation;Ultrasound;Moist Heat;Therapeutic activities;Therapeutic exercise;Neuromuscular re-education;Manual techniques;Patient/family education;Passive range of motion;Dry needling     PT Next Visit Plan progress ther ex and neuro re-ed including scapular stabs.  Cont with manual techniques including jt mobs and PROM.    PT Home Exercise Plan Access Code: [email or web address]    Consulted and Agree with Plan of Care Patient             Patient will benefit from skilled therapeutic intervention in order to improve the following deficits and impairments:  Decreased range of motion, Decreased strength, Impaired flexibility, Postural dysfunction, Pain, Decreased activity tolerance  Visit Diagnosis: Acute pain of left shoulder  Muscle weakness (generalized)  Stiffness of left shoulder, not elsewhere classified     Problem List There are no problems to display for this patient.   Selinda Michaels III PT, DPT 10/26/20 10:58 PM   [facility] [address] [place], [place], [address] Phone: [phone]   Fax:  [phone]  Name: Timothy Swanson MRN: [id number] Date of Birth: 30-Sep-1970

## 2020-10-30 ENCOUNTER — Encounter (HOSPITAL_BASED_OUTPATIENT_CLINIC_OR_DEPARTMENT_OTHER): Payer: Self-pay | Admitting: Physical Therapy

## 2020-10-30 ENCOUNTER — Ambulatory Visit (HOSPITAL_BASED_OUTPATIENT_CLINIC_OR_DEPARTMENT_OTHER): Payer: 59 | Admitting: Physical Therapy

## 2020-10-30 ENCOUNTER — Other Ambulatory Visit: Payer: Self-pay

## 2020-10-30 DIAGNOSIS — M25512 Pain in left shoulder: Secondary | ICD-10-CM

## 2020-10-30 DIAGNOSIS — M6281 Muscle weakness (generalized): Secondary | ICD-10-CM

## 2020-10-30 DIAGNOSIS — M25612 Stiffness of left shoulder, not elsewhere classified: Secondary | ICD-10-CM

## 2020-10-30 NOTE — Therapy (Signed)
[facility] [address] [place], [place], [address] Phone: [phone]   Fax:  [phone]  Physical Therapy Treatment  Patient Details  Name: Timothy Swanson MRN: [id number] Date of Birth: [birth year] Referring Provider (PT): Inez Catalina, MD   Encounter Date: 10/30/2020   PT End of Session - 10/30/20 0812     Visit Number 11    Number of Visits 14    Date for PT Re-Evaluation 11/05/20    Authorization Type UHC    PT Start Time 0807    PT Stop Time 0850    PT Time Calculation (min) 43 min    Activity Tolerance Patient tolerated treatment well    Behavior During Therapy [facility] for tasks assessed/performed             Past Medical History:  Diagnosis Date   ADD (attention deficit disorder)    ED (erectile dysfunction)    Elevated LDL cholesterol level    Family history of diabetes mellitus (DM)    GERD (gastroesophageal reflux disease)    Hypertension    Low back strain    Low blood sugar    Malaise    Psoriasis    Tinea cruris     Past Surgical History:  Procedure Laterality Date   NO PAST SURGERIES      There were no vitals filed for this visit.   Subjective Assessment - 10/30/20 0810     Subjective Pt denies pain currently.  He states he had no adverse effects after prior Rx and wasn't really sore.  "Everyday is better".   Pt reports he has 80-90% of his motion back.  pt reports he is able to reach overhead better.  He is uncomfortable with prolonged sitting.    Pertinent History Psoriasis    Currently in Pain? No/denies                               [facility] Adult PT Treatment/Exercise - 10/30/20 0001       Exercises   Exercises Shoulder   Reviewed response to prior Rx, current function, reported functional progress/deficits, HEP compiance, and pain levels.     Shoulder Exercises: Supine   Other Supine Exercises supine rhythmic stabilization at 90/60/120 deg x 1 min each       Shoulder Exercises: Seated   Other Seated Exercises seated UT stretch 2 reps of 20-30 sec hold bilat      Shoulder Exercises: Prone   Other Prone Exercises Prone extension 2x10 with 2#, prone row 2x10 with 3#, prone horizontal abductionwith 1# 2 x10 each      Shoulder Exercises: Standing   Other Standing Exercises ER/IR with RTB 2x10 reps each    Other Standing Exercises 4D ball rolls on wall at 90 deg flexion 2 x 15 reps each.  serratus plus on wall 2 x10 reps, wall walks with RTB 3x 5 reps      Manual Therapy   Manual Therapy Joint mobilization;Passive ROM    Joint Mobilization Grade II PA and inferior jt mobs to L GH to improve pain, stiffness/ROM, and to normalize arthrokinematics    Passive ROM f/b L shoulder PROM in flex, abd, and ER to improve mobility                     PT Education - 10/30/20 1058     Education Details Educated pt concerning POC.  Person(s) Educated Patient    Methods Explanation    Comprehension Verbalized understanding              PT Short Term Goals - 10/15/20 0910       PT SHORT TERM GOAL #1   Title Pt will be independent and compliant with HEP for improved pain, ROM, strength, and function.    Time 3    Period Weeks    Status Achieved    Target Date 10/05/20      PT SHORT TERM GOAL #2   Title Pt will demo at least 10-15 deg of AROM in flexion, scaption, and abd for improved reaching and functional mobility.    Time 3    Period Weeks    Status Achieved    Target Date 10/05/20      PT SHORT TERM GOAL #3   Title Pt will report at least a 25% improvement in pain with driving and using computer    Baseline Pt repors 70-75% improvement    Time 3    Status Achieved    Target Date 10/05/20      PT SHORT TERM GOAL #4   Title Pt will be able to wash his hair without difficulty or significant pain.    Time 4    Period Weeks    Status Achieved    Target Date 10/12/20               PT Long Term Goals - 10/15/20 0911        PT LONG TERM GOAL #1   Title Pt will demo improved L shoulder flex, scaption, and abd AROM to be w/n 5 deg of R shoulder for improved reaching overhead.    Time 3    Period Weeks    Status Partially Met    Target Date 11/05/20      PT LONG TERM GOAL #2   Title Pt will report he is able to perform his normal overhead activities including reaching into a cabinet without increased pain or difficulty.    Time 3    Period Weeks    Status Partially Met    Target Date 11/05/20      PT LONG TERM GOAL #3   Title Pt will be able to drive without significant pain.    Time 6    Period Weeks    Status Achieved    Target Date 10/26/20      PT LONG TERM GOAL #4   Title Pt will demo 5/5 L shoulder strength t/o for functional carrying/lifting and to assist with returning to workout activities.    Time 3    Period Weeks    Status Partially Met    Target Date 11/05/20      PT LONG TERM GOAL #5   Title Pt will be able to perform his ADLs and IADLs without significant pain or difficulty    Time 3    Period Weeks    Status Partially Met    Target Date 11/05/20                   Plan - 10/30/20 1053     Clinical Impression Statement Pt continues to make excellent progress in PT and has reduced pain overall and with activity.  Pt demonstrates improved strength and stability t/o L shoulder and scapula as evidenced by performance and progression of ther ex and neuro re-ed exercises.  He demonstrates improved form overall with exercises.  He responded well to Rx reporting no pain after Rx. Pt should cont to benefit from cont skilled PT services to address ongoing goals and to restore PLOF.  He should be ready for discharge next week and pt is agreeable.    Comorbidities psoriasis    PT Treatment/Interventions ADLs/Self Care Home Management;Cryotherapy;Electrical Stimulation;Ultrasound;Moist Heat;Therapeutic activities;Therapeutic exercise;Neuromuscular re-education;Manual  techniques;Patient/family education;Passive range of motion;Dry needling    PT Next Visit Plan progress ther ex and neuro re-ed including scapular stabs.  Cont with manual techniques including jt mobs and PROM.  Review HEP and progress appropriately.  Discharge pt next week.    PT Home Exercise Plan Access Code: [email or web address]    Consulted and Agree with Plan of Care Patient             Patient will benefit from skilled therapeutic intervention in order to improve the following deficits and impairments:  Decreased range of motion, Decreased strength, Impaired flexibility, Postural dysfunction, Pain, Decreased activity tolerance  Visit Diagnosis: Acute pain of left shoulder  Muscle weakness (generalized)  Stiffness of left shoulder, not elsewhere classified     Problem List There are no problems to display for this patient.   Selinda Michaels III PT, DPT 10/30/20 11:04 AM   [facility] [address] [place], [place], [address] Phone: [phone]   Fax:  [phone]  Name: Timothy Swanson MRN: [id number] Date of Birth: 08/16/1970

## 2020-11-02 ENCOUNTER — Ambulatory Visit (HOSPITAL_BASED_OUTPATIENT_CLINIC_OR_DEPARTMENT_OTHER): Payer: 59 | Admitting: Physical Therapy

## 2020-11-02 ENCOUNTER — Other Ambulatory Visit: Payer: Self-pay

## 2020-11-02 ENCOUNTER — Encounter (HOSPITAL_BASED_OUTPATIENT_CLINIC_OR_DEPARTMENT_OTHER): Payer: Self-pay | Admitting: Physical Therapy

## 2020-11-02 DIAGNOSIS — M25512 Pain in left shoulder: Secondary | ICD-10-CM | POA: Diagnosis not present

## 2020-11-02 DIAGNOSIS — M6281 Muscle weakness (generalized): Secondary | ICD-10-CM

## 2020-11-02 DIAGNOSIS — M25612 Stiffness of left shoulder, not elsewhere classified: Secondary | ICD-10-CM

## 2020-11-02 NOTE — Therapy (Signed)
[facility] [address] [place], [place], [address] Phone: [phone]   Fax:  [phone]  Physical Therapy Treatment  Patient Details  Name: Timothy Swanson MRN: [id number] Date of Birth: [birth year] Referring Provider (PT): Inez Catalina, MD   Encounter Date: 11/02/2020   PT End of Session - 11/02/20 0926     Visit Number 12    Number of Visits 14    Date for PT Re-Evaluation 11/05/20    Authorization Type U[employer]    PT Start Time 0852    PT Stop Time 0924    PT Time Calculation (min) 32 min    Activity Tolerance Patient tolerated treatment well    Behavior During Therapy [facility] for tasks assessed/performed             Past Medical History:  Diagnosis Date   ADD (attention deficit disorder)    ED (erectile dysfunction)    Elevated LDL cholesterol level    Family history of diabetes mellitus (DM)    GERD (gastroesophageal reflux disease)    Hypertension    Low back strain    Low blood sugar    Malaise    Psoriasis    Tinea cruris     Past Surgical History:  Procedure Laterality Date   NO PAST SURGERIES      There were no vitals filed for this visit.   Subjective Assessment - 11/02/20 0915     Subjective Pt states he had soreness for 2 days after prior Rx.  Pt states he had to do some driving to pick up his mother and then sit at a football game which could have contributed.  Pt states he performed his stretching and exercises without weight which improved his sx's.  Pt reports he is able to reach overhead better.  Pt states he needs to leave a little early today due to work.    Pertinent History Psoriasis    Pain Score --   0-.5/10   Pain Location Shoulder    Pain Orientation Left                               [facility] Adult PT Treatment/Exercise - 11/02/20 0001       Exercises   Exercises Shoulder   Reviewed response to prior Rx, current function, reported functional  progress/deficits, HEP compiance, and pain levels.     Shoulder Exercises: Prone   Other Prone Exercises Prone extension 2x10 with 2#, prone row 2x10 with 3#, prone horizontal abductionwith 1# 2 x10 each      Shoulder Exercises: Standing   Other Standing Exercises ER/IR with RTB 2x10 reps each    Other Standing Exercises 4D ball rolls on wall at 90 deg flexion 2 x 15 reps each.  serratus plus on wall 2 x10 reps, wall walks with RTB 3x 5 reps      Manual Therapy   Manual Therapy Joint mobilization;Passive ROM    Joint Mobilization Grade II PA and inferior jt mobs to L GH to improve pain, stiffness/ROM, and to normalize arthrokinematics    Passive ROM f/b L shoulder PROM in flex, abd, and ER to improve mobility                     PT Education - 11/02/20 0941     Education Details Educated pt concerning POC.  Educated pt in appropriate soreness.    Person(s)  Educated Patient    Methods Explanation    Comprehension Verbalized understanding              PT Short Term Goals - 10/15/20 0910       PT SHORT TERM GOAL #1   Title Pt will be independent and compliant with HEP for improved pain, ROM, strength, and function.    Time 3    Period Weeks    Status Achieved    Target Date 10/05/20      PT SHORT TERM GOAL #2   Title Pt will demo at least 10-15 deg of AROM in flexion, scaption, and abd for improved reaching and functional mobility.    Time 3    Period Weeks    Status Achieved    Target Date 10/05/20      PT SHORT TERM GOAL #3   Title Pt will report at least a 25% improvement in pain with driving and using computer    Baseline Pt repors 70-75% improvement    Time 3    Status Achieved    Target Date 10/05/20      PT SHORT TERM GOAL #4   Title Pt will be able to wash his hair without difficulty or significant pain.    Time 4    Period Weeks    Status Achieved    Target Date 10/12/20               PT Long Term Goals - 10/15/20 0911       PT  LONG TERM GOAL #1   Title Pt will demo improved L shoulder flex, scaption, and abd AROM to be w/n 5 deg of R shoulder for improved reaching overhead.    Time 3    Period Weeks    Status Partially Met    Target Date 11/05/20      PT LONG TERM GOAL #2   Title Pt will report he is able to perform his normal overhead activities including reaching into a cabinet without increased pain or difficulty.    Time 3    Period Weeks    Status Partially Met    Target Date 11/05/20      PT LONG TERM GOAL #3   Title Pt will be able to drive without significant pain.    Time 6    Period Weeks    Status Achieved    Target Date 10/26/20      PT LONG TERM GOAL #4   Title Pt will demo 5/5 L shoulder strength t/o for functional carrying/lifting and to assist with returning to workout activities.    Time 3    Period Weeks    Status Partially Met    Target Date 11/05/20      PT LONG TERM GOAL #5   Title Pt will be able to perform his ADLs and IADLs without significant pain or difficulty    Time 3    Period Weeks    Status Partially Met    Target Date 11/05/20                   Plan - 11/02/20 0943     Clinical Impression Statement Pt continues to make excellent progress in PT.  Pt reports having soreness after prior Rx which improved with HEP.  He is improving with L shoulder strength and stability.  He has full PROM.  Pt has improved with function and tolerance to activity.  Pt performed exercises well  and demonstrated good form with exercises.  He responded well to Rx having no c/o's after Rx.  Pt should be ready for discharge next visit.    Comorbidities psoriasis    PT Treatment/Interventions ADLs/Self Care Home Management;Cryotherapy;Electrical Stimulation;Ultrasound;Moist Heat;Therapeutic activities;Therapeutic exercise;Neuromuscular re-education;Manual techniques;Patient/family education;Passive range of motion;Dry needling    PT Next Visit Plan Update HEP by adding lateral band  walks, UT stretch, and possibly T band ER/IR.  Discharge pt next visit    PT Home Exercise Plan Access Code: [email or web address]    Consulted and Agree with Plan of Care Patient             Patient will benefit from skilled therapeutic intervention in order to improve the following deficits and impairments:  Decreased range of motion, Decreased strength, Impaired flexibility, Postural dysfunction, Pain, Decreased activity tolerance  Visit Diagnosis: Acute pain of left shoulder  Muscle weakness (generalized)  Stiffness of left shoulder, not elsewhere classified     Problem List There are no problems to display for this patient.   Selinda Michaels III PT, DPT 11/02/20 9:49 AM   [facility] [place], [place], [address] Phone: [phone]   Fax:  [phone]  Name: Timothy Swanson MRN: [id number] Date of Birth: July 22, 1970

## 2020-11-06 ENCOUNTER — Ambulatory Visit (HOSPITAL_BASED_OUTPATIENT_CLINIC_OR_DEPARTMENT_OTHER): Payer: 59 | Admitting: Physical Therapy

## 2020-11-06 ENCOUNTER — Other Ambulatory Visit: Payer: Self-pay

## 2020-11-06 ENCOUNTER — Encounter (HOSPITAL_BASED_OUTPATIENT_CLINIC_OR_DEPARTMENT_OTHER): Payer: Self-pay | Admitting: Physical Therapy

## 2020-11-06 DIAGNOSIS — M25612 Stiffness of left shoulder, not elsewhere classified: Secondary | ICD-10-CM

## 2020-11-06 DIAGNOSIS — M6281 Muscle weakness (generalized): Secondary | ICD-10-CM

## 2020-11-06 DIAGNOSIS — M25512 Pain in left shoulder: Secondary | ICD-10-CM | POA: Diagnosis not present

## 2020-11-06 NOTE — Therapy (Signed)
[facility] [address] [place], [place], [address] Phone: [phone]   Fax:  [phone]  Physical Therapy Treatment  Patient Details  Name: Timothy Swanson MRN: [id number] Date of Birth: [birth year] Referring Provider (PT): Inez Catalina, MD   Encounter Date: 11/06/2020   PT End of Session - 11/06/20 0910     Visit Number 13    Number of Visits 14    Date for PT Re-Evaluation 11/06/20    Authorization Type UHC    PT Start Time 0803    PT Stop Time 0853    PT Time Calculation (min) 50 min    Activity Tolerance Patient tolerated treatment well    Behavior During Therapy [facility] for tasks assessed/performed             Past Medical History:  Diagnosis Date   ADD (attention deficit disorder)    ED (erectile dysfunction)    Elevated LDL cholesterol level    Family history of diabetes mellitus (DM)    GERD (gastroesophageal reflux disease)    Hypertension    Low back strain    Low blood sugar    Malaise    Psoriasis    Tinea cruris     Past Surgical History:  Procedure Laterality Date   NO PAST SURGERIES      There were no vitals filed for this visit.   Subjective Assessment - 11/06/20 0804     Subjective Pt states he has been performing his HEP though has missed some of his exercises due to being busy with work. pt reports improved strength.  Pt denies any limitations with ADLs/IADLs and household chores.  Pt states he still favors L UE with lifting.  Pt states he doesn't have the knots he used to.  Pt states he is able to perform overhead activities well.    Pertinent History Psoriasis    Pain Score --   0/10 current pain, 1/10 worst pain               [facility] PT Assessment - 11/06/20 0001       AROM   AROM Assessment Site Shoulder    Left Shoulder Flexion 164 Degrees    Left Shoulder ABduction 146 Degrees   162 in scaption   Left Shoulder External Rotation 85 Degrees      Strength   Left  Shoulder Flexion 5/5    Left Shoulder ABduction 5/5   scaption:  5/5   Left Shoulder External Rotation 5/5                           [facility] Adult PT Treatment/Exercise - 11/06/20 0001       Exercises   Exercises Shoulder   Reviewed response to prior Rx, current function, reported functional progress/deficits, HEP compiance, and pain levels.  Assessed ROM and strength.  Assessed goals.  Updated HEP and gave pt handout.     Shoulder Exercises: Seated   Other Seated Exercises seated UT stretch 2 reps of 20-30 sec hold bilat      Shoulder Exercises: Standing   Other Standing Exercises ER/IR with RTB 3x10 reps each    Other Standing Exercises 4D ball rolls on wall with 1.1 lb at 90 deg flexion 2 x 15 reps each.  serratus plus on wall 2 x10 reps, wall walks with RTB 3x 5 reps      Manual Therapy   Manual Therapy Joint mobilization;Passive  ROM    Joint Mobilization Grade II PA and inferior jt mobs to L GH to improve pain, stiffness/ROM, and to normalize arthrokinematics    Passive ROM f/b L shoulder PROM in flex, abd, and ER to improve mobility                     PT Education - 11/06/20 0823     Education Details Educatd pt concerning objective findings and progress.  Educated pt in appropriate gym precautions.  Reviewed and updated HEP.  Gave pt an updated HEP and educated pt in correct form and appropriate frequency.  Access Code: [email or web address]  URL: [email or web address]  Date: 11/06/2020  Prepared by: Ronny Flurry    Exercises  Seated Scapular Retraction - 2 x daily - 7 x weekly - 2 sets - 10 reps - 3 second hold  Supine Shoulder Flexion Extension AAROM with Dowel - 2 x daily - 7 x weekly - 2 sets - 10 reps  Standing Shoulder Flexion Wall Walk - 2 x daily - 7 x weekly - 1 sets - 10 reps  Supine Shoulder Alphabet - 1 x daily - 7 x weekly - 1 sets - 1 reps  Prone Shoulder Extension - Single Arm - 1 x daily - 3-4 x weekly - 2-3 sets - 10 reps  Prone Shoulder  Row - 1 x daily - 3-4 x weekly - 2-3 sets - 10 reps  Shoulder External Rotation with Anchored Resistance - 1 x daily - 3-4 x weekly - 2-3 sets - 10 reps  Shoulder Internal Rotation with Resistance - 1 x daily - 3-4 x weekly - 2-3 sets - 10 reps  Horizontal Wall Walk with Resistance - 1 x daily - 3 x weekly - 3 sets - 5 reps  Sidelying Shoulder ER with Towel and Dumbbell - 1 x daily - 3-4 x weekly - 2-3 sets - 10 reps  Seated Upper Trapezius Stretch - 1 x daily - 7 x weekly - 1 sets - 2-3 reps - 20-30 seconds hold  Prone Single Arm Shoulder Horizontal Abduction with Scapular Retraction and Palm Down - 1 x daily - 3-4 x weekly - 2 sets - 10 reps    Person(s) Educated Patient    Methods Explanation;Demonstration;Verbal cues;Handout    Comprehension Verbalized understanding;Returned demonstration              PT Short Term Goals - 10/15/20 0910       PT SHORT TERM GOAL #1   Title Pt will be independent and compliant with HEP for improved pain, ROM, strength, and function.    Time 3    Period Weeks    Status Achieved    Target Date 10/05/20      PT SHORT TERM GOAL #2   Title Pt will demo at least 10-15 deg of AROM in flexion, scaption, and abd for improved reaching and functional mobility.    Time 3    Period Weeks    Status Achieved    Target Date 10/05/20      PT SHORT TERM GOAL #3   Title Pt will report at least a 25% improvement in pain with driving and using computer    Baseline Pt repors 70-75% improvement    Time 3    Status Achieved    Target Date 10/05/20      PT SHORT TERM GOAL #4   Title Pt will be able to wash his hair without difficulty or significant  pain.    Time 4    Period Weeks    Status Achieved    Target Date 10/12/20               PT Long Term Goals - 11/06/20 1111       PT LONG TERM GOAL #1   Title Pt will demo improved L shoulder flex, scaption, and abd AROM to be w/n 5 deg of R shoulder for improved reaching overhead.    Time 3    Period Weeks     Status Achieved    Target Date 11/05/20      PT LONG TERM GOAL #2   Title Pt will report he is able to perform his normal overhead activities including reaching into a cabinet without increased pain or difficulty.    Time 3    Period Weeks    Status Achieved    Target Date 11/05/20      PT LONG TERM GOAL #3   Title Pt will be able to drive without significant pain.    Time 6    Period Weeks    Status Achieved    Target Date 10/26/20      PT LONG TERM GOAL #4   Title Pt will demo 5/5 L shoulder strength t/o for functional carrying/lifting and to assist with returning to workout activities.    Time 3    Period Weeks    Status Achieved    Target Date 11/05/20      PT LONG TERM GOAL #5   Title Pt will be able to perform his ADLs and IADLs without significant pain or difficulty    Time 3    Period Weeks    Status Achieved    Target Date 11/05/20                   Plan - 11/06/20 1109     Clinical Impression Statement Pt has made excellent progress in PT.  He is able to perform his ADLs/IADLs and overhead activities well without difficulty. Pt demonstrates improved shoulder AROM and is symmetrical with R UE.  Pt demonstrates 5/5 L shoulder strength.  Pt has met all STG's and LTG's.  Updated HEP and went through HEP with pt educating pt on appropriate frequency and resistance.  Pt demonstrates good understanding of HEP.  He is ready for discharge and is agreeable with discharge.    Comorbidities psoriasis    PT Treatment/Interventions ADLs/Self Care Home Management;Cryotherapy;Electrical Stimulation;Ultrasound;Moist Heat;Therapeutic activities;Therapeutic exercise;Neuromuscular re-education;Manual techniques;Patient/family education;Passive range of motion;Dry needling    PT Next Visit Plan Pt discharged today    PT Home Exercise Plan Access Code: [email or web address].  Updated HEP             Patient will benefit from skilled therapeutic intervention in order to improve the  following deficits and impairments:  Decreased range of motion, Decreased strength, Impaired flexibility, Postural dysfunction, Pain, Decreased activity tolerance  Visit Diagnosis: Acute pain of left shoulder  Muscle weakness (generalized)  Stiffness of left shoulder, not elsewhere classified     Problem List There are no problems to display for this patient.  PHYSICAL THERAPY DISCHARGE SUMMARY  Visits from Start of Care: 13  Current functional level related to goals / functional outcomes: See above   Remaining deficits: See above   Education / Equipment: See above   Patient agrees to discharge. Patient goals were met. Patient is being discharged due to meeting the stated rehab goals.  Selinda Michaels III PT, DPT 11/06/20 11:21 AM   [facility] [place], [place], [address] Phone: [phone]   Fax:  [phone]  Name: Timothy Swanson MRN: [id number] Date of Birth: [birth year]

## 2022-01-24 IMAGING — DX DG CHEST 1V PORT
1 series · 1 of 1 positions shown · non-contrast
Comparison: None.

CLINICAL DATA: Shortness of breath, [id number] symptoms

EXAM:
PORTABLE CHEST 1 VIEW

[chest ap]
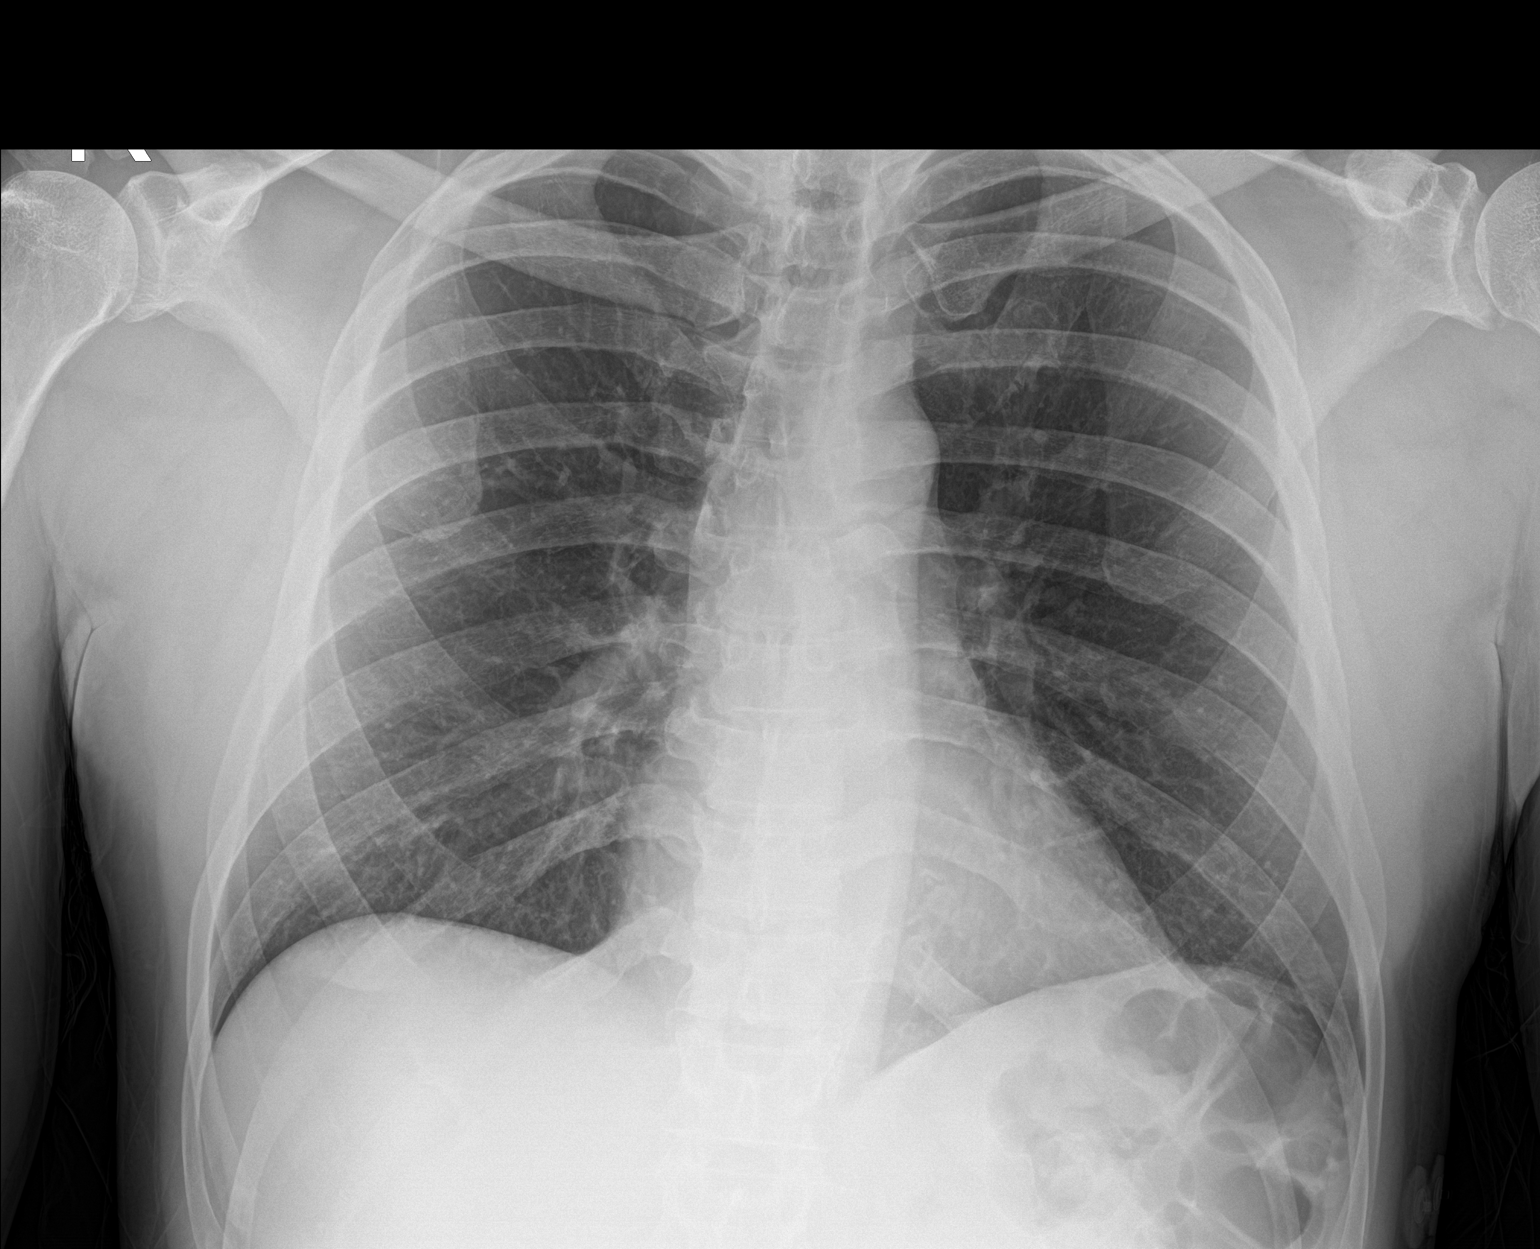

[1 of 1 positions shown; findings below may reference images not displayed]

FINDINGS: No consolidation, features of edema, pneumothorax, or effusion.
Pulmonary vascularity is normally distributed. The cardiomediastinal
contours are unremarkable. No acute osseous or soft tissue
abnormality.
IMPRESSION: No acute cardiopulmonary abnormality.

## 2022-03-11 ENCOUNTER — Encounter (HOSPITAL_BASED_OUTPATIENT_CLINIC_OR_DEPARTMENT_OTHER): Payer: Self-pay | Admitting: Emergency Medicine

## 2022-03-11 ENCOUNTER — Emergency Department (HOSPITAL_BASED_OUTPATIENT_CLINIC_OR_DEPARTMENT_OTHER)
Admission: EM | Admit: 2022-03-11 | Discharge: 2022-03-11 | Disposition: A | Discharge status: Home / Self Care | Payer: 59 | Attending: Emergency Medicine | Admitting: Emergency Medicine

## 2022-03-11 ENCOUNTER — Emergency Department (HOSPITAL_BASED_OUTPATIENT_CLINIC_OR_DEPARTMENT_OTHER): Payer: 59

## 2022-03-11 ENCOUNTER — Other Ambulatory Visit: Payer: Self-pay

## 2022-03-11 DIAGNOSIS — Z7982 Long term (current) use of aspirin: Secondary | ICD-10-CM | POA: Diagnosis not present

## 2022-03-11 DIAGNOSIS — Z23 Encounter for immunization: Secondary | ICD-10-CM | POA: Diagnosis not present

## 2022-03-11 DIAGNOSIS — W108XXA Fall (on) (from) other stairs and steps, initial encounter: Secondary | ICD-10-CM | POA: Diagnosis not present

## 2022-03-11 DIAGNOSIS — I10 Essential (primary) hypertension: Secondary | ICD-10-CM | POA: Insufficient documentation

## 2022-03-11 DIAGNOSIS — S060X1A Concussion with loss of consciousness of 30 minutes or less, initial encounter: Secondary | ICD-10-CM | POA: Insufficient documentation

## 2022-03-11 DIAGNOSIS — S01511A Laceration without foreign body of lip, initial encounter: Secondary | ICD-10-CM | POA: Insufficient documentation

## 2022-03-11 DIAGNOSIS — S0990XA Unspecified injury of head, initial encounter: Secondary | ICD-10-CM | POA: Diagnosis present

## 2022-03-11 DIAGNOSIS — Z79899 Other long term (current) drug therapy: Secondary | ICD-10-CM | POA: Insufficient documentation

## 2022-03-11 MED ORDER — AMOXICILLIN 500 MG PO CAPS: 500.0000 mg | ORAL_CAPSULE | Freq: Three times a day (TID) | ORAL | 0 refills | Status: AC

## 2022-03-11 MED ORDER — LIDOCAINE-EPINEPHRINE (PF) 1 %-1:200000 IJ SOLN
20.0000 mL | Freq: Once | INTRAMUSCULAR | Status: AC
  Administered 2022-03-11: 20 mL
  Filled 2022-03-11: qty 30

## 2022-03-11 MED ORDER — TETANUS-DIPHTH-ACELL PERTUSSIS 5-2.5-18.5 LF-MCG/0.5 IM SUSY
0.5000 mL | PREFILLED_SYRINGE | Freq: Once | INTRAMUSCULAR | Status: AC
  Administered 2022-03-11: 0.5 mL via INTRAMUSCULAR
  Filled 2022-03-11: qty 0.5

## 2022-03-11 MED ORDER — AMOXICILLIN 500 MG PO CAPS: 500.0000 mg | ORAL_CAPSULE | Freq: Three times a day (TID) | ORAL | 0 refills | Status: DC

## 2022-03-11 MED ORDER — AMOXICILLIN 500 MG PO CAPS
500.0000 mg | ORAL_CAPSULE | Freq: Once | ORAL | Status: AC
  Administered 2022-03-11: 500 mg via ORAL
  Filled 2022-03-11: qty 1

## 2022-03-11 NOTE — ED Provider Notes (Signed)
[facility] Provider Note   CSN: [id number] Arrival date & time: 03/11/22  0320     History  Chief Complaint  Patient presents with   Lytle Michaels    Timothy Swanson is a 52 y.o. male.  The history is provided by the patient.  Fall  He has history of hypertension, hyperlipidemia, GERD, psoriasis and comes in following a fall downstairs.  He endorses loss of consciousness for at least a few minutes.  He hit his head on the wall and suffered a laceration of his lower lip.  He does not know when his last tetanus immunization was.  He has been ambulatory since then, but unsteady on his feet.  He is not on any anticoagulants, but does take low-dose aspirin.   Home Medications Prior to Admission medications   Medication Sig Start Date End Date Taking? Authorizing Provider  aspirin EC 81 MG tablet Take 81 mg by mouth daily.    [provider]  Cholecalciferol (VITAMIN D3) 50 MCG (2000 UT) capsule Take 2,000 Units by mouth daily. 01/17/20   [provider]  clonazePAM (KLONOPIN) 0.5 MG tablet Take 1 tablet by mouth at bedtime. 02/17/20   [provider]  lisinopril-hydrochlorothiazide (PRINZIDE,ZESTORETIC) 20-12.5 MG tablet Take 1 tablet by mouth daily.    [provider]  omeprazole (PRILOSEC) 20 MG capsule Take 20 mg by mouth daily.    [provider]  potassium chloride (K-DUR,KLOR-CON) 10 MEQ tablet Take 10 mEq by mouth 2 (two) times daily.    [provider]  sildenafil (REVATIO) 20 MG tablet SMARTSIG:3-5 Tablet(s) By Mouth 01/17/20   [provider]  calcium-vitamin D (OSCAL WITH D) 500-200 MG-UNIT tablet Take 1 tablet by mouth.  01/28/20  [provider]      Allergies    Patient has no known allergies.    Review of Systems   Review of Systems  All other systems reviewed and are negative.   Physical Exam Updated Vital Signs BP 114/80 (BP Location: Right Arm)   Pulse  82   Temp 98.2 F (36.8 C) (Oral)   Resp 18   Ht 5\' 10"  (1.778 m)   Wt 84.8 kg   SpO2 98%   BMI 26.83 kg/m  Physical Exam Vitals and nursing note reviewed.   52 year old male, resting comfortably and in no acute distress. Vital signs are normal. Oxygen saturation is 98%, which is normal. Head is normocephalic.  Full-thickness laceration is present in the lower lip. PERRLA, EOMI. Oropharynx is clear.  There is no dental injury.  There is moderate tenderness to palpation over the left side of the jaw without any deformity or crepitus. Neck is nontender without adenopathy or JVD. Back is nontender and there is no CVA tenderness. Lungs are clear without rales, wheezes, or rhonchi. Chest is nontender. Heart has regular rate and rhythm without murmur. Abdomen is soft, flat, nontender. Extremities have no cyanosis or edema, full range of motion is present. Skin is warm and dry without rash. Neurologic: Mental status is normal, cranial nerves are intact, strength is 5/5 in all 4 extremities.  ED Results / Procedures / Treatments    Radiology CT Head Wo Contrast  Result Date: 03/11/2022 CLINICAL DATA:  Fall with head, facial, and neck trauma. Laceration to bottom lip. EXAM: CT HEAD WITHOUT CONTRAST CT MAXILLOFACIAL WITHOUT CONTRAST CT CERVICAL SPINE WITHOUT CONTRAST TECHNIQUE: Multidetector CT imaging of the head, cervical spine, and maxillofacial structures were performed  using the standard protocol without intravenous contrast. Multiplanar CT image reconstructions of the cervical spine and maxillofacial structures were also generated. RADIATION DOSE REDUCTION: This exam was performed according to the departmental dose-optimization program which includes automated exposure control, adjustment of the mA and/or kV according to patient size and/or use of iterative reconstruction technique. COMPARISON:  None Available. FINDINGS: CT HEAD FINDINGS Brain: No acute intracranial hemorrhage, midline shift  or mass effect. No extra-axial fluid collection. Gray-white matter differentiation is within normal limits. No hydrocephalus. Vascular: No hyperdense vessel or unexpected calcification. Skull: Normal. Negative for fracture or focal lesion. Other: None. CT MAXILLOFACIAL FINDINGS Osseous: No fracture or mandibular dislocation. No destructive process. Orbits: Negative. No traumatic or inflammatory finding. Sinuses: Clear. Soft tissues: Subcutaneous fat stranding is noted over the left cheek, likely contusion. CT CERVICAL SPINE FINDINGS Alignment: Normal. Skull base and vertebrae: No acute fracture. No primary bone lesion or focal pathologic process. Soft tissues and spinal canal: No prevertebral fluid or swelling. No visible canal hematoma. Disc levels: Mild degenerative changes in the cervical spine with calcification of the posterior longitudinal ligament at C3 to C6. Upper chest: No acute abnormality. Other: None. IMPRESSION: 1. No acute intracranial process. 2. No evidence of acute facial bone fracture. 3. Mild degenerative changes in the cervical spine without evidence of acute fracture. Electronically Signed   By: Thornell Sartorius M.D.   On: 03/11/2022 04:29   CT Cervical Spine Wo Contrast  Result Date: 03/11/2022 CLINICAL DATA:  Fall with head, facial, and neck trauma. Laceration to bottom lip. EXAM: CT HEAD WITHOUT CONTRAST CT MAXILLOFACIAL WITHOUT CONTRAST CT CERVICAL SPINE WITHOUT CONTRAST TECHNIQUE: Multidetector CT imaging of the head, cervical spine, and maxillofacial structures were performed using the standard protocol without intravenous contrast. Multiplanar CT image reconstructions of the cervical spine and maxillofacial structures were also generated. RADIATION DOSE REDUCTION: This exam was performed according to the departmental dose-optimization program which includes automated exposure control, adjustment of the mA and/or kV according to patient size and/or use of iterative reconstruction  technique. COMPARISON:  None Available. FINDINGS: CT HEAD FINDINGS Brain: No acute intracranial hemorrhage, midline shift or mass effect. No extra-axial fluid collection. Gray-white matter differentiation is within normal limits. No hydrocephalus. Vascular: No hyperdense vessel or unexpected calcification. Skull: Normal. Negative for fracture or focal lesion. Other: None. CT MAXILLOFACIAL FINDINGS Osseous: No fracture or mandibular dislocation. No destructive process. Orbits: Negative. No traumatic or inflammatory finding. Sinuses: Clear. Soft tissues: Subcutaneous fat stranding is noted over the left cheek, likely contusion. CT CERVICAL SPINE FINDINGS Alignment: Normal. Skull base and vertebrae: No acute fracture. No primary bone lesion or focal pathologic process. Soft tissues and spinal canal: No prevertebral fluid or swelling. No visible canal hematoma. Disc levels: Mild degenerative changes in the cervical spine with calcification of the posterior longitudinal ligament at C3 to C6. Upper chest: No acute abnormality. Other: None. IMPRESSION: 1. No acute intracranial process. 2. No evidence of acute facial bone fracture. 3. Mild degenerative changes in the cervical spine without evidence of acute fracture. Electronically Signed   By: Thornell Sartorius M.D.   On: 03/11/2022 04:29   CT Maxillofacial Wo Contrast  Result Date: 03/11/2022 CLINICAL DATA:  Fall with head, facial, and neck trauma. Laceration to bottom lip. EXAM: CT HEAD WITHOUT CONTRAST CT MAXILLOFACIAL WITHOUT CONTRAST CT CERVICAL SPINE WITHOUT CONTRAST TECHNIQUE: Multidetector CT imaging of the head, cervical spine, and maxillofacial structures were performed using the standard protocol without intravenous contrast. Multiplanar CT image reconstructions of the  cervical spine and maxillofacial structures were also generated. RADIATION DOSE REDUCTION: This exam was performed according to the departmental dose-optimization program which includes automated  exposure control, adjustment of the mA and/or kV according to patient size and/or use of iterative reconstruction technique. COMPARISON:  None Available. FINDINGS: CT HEAD FINDINGS Brain: No acute intracranial hemorrhage, midline shift or mass effect. No extra-axial fluid collection. Gray-white matter differentiation is within normal limits. No hydrocephalus. Vascular: No hyperdense vessel or unexpected calcification. Skull: Normal. Negative for fracture or focal lesion. Other: None. CT MAXILLOFACIAL FINDINGS Osseous: No fracture or mandibular dislocation. No destructive process. Orbits: Negative. No traumatic or inflammatory finding. Sinuses: Clear. Soft tissues: Subcutaneous fat stranding is noted over the left cheek, likely contusion. CT CERVICAL SPINE FINDINGS Alignment: Normal. Skull base and vertebrae: No acute fracture. No primary bone lesion or focal pathologic process. Soft tissues and spinal canal: No prevertebral fluid or swelling. No visible canal hematoma. Disc levels: Mild degenerative changes in the cervical spine with calcification of the posterior longitudinal ligament at C3 to C6. Upper chest: No acute abnormality. Other: None. IMPRESSION: 1. No acute intracranial process. 2. No evidence of acute facial bone fracture. 3. Mild degenerative changes in the cervical spine without evidence of acute fracture. Electronically Signed   By: Brett Fairy M.D.   On: 03/11/2022 04:29    Procedures .Marland KitchenLaceration Repair  Date/Time: 03/11/2022 4:34 AM  Performed by: Delora Fuel, MD Authorized by: Delora Fuel, MD   Consent:    Consent obtained:  Verbal   Consent given by:  Patient   Risks, benefits, and alternatives were discussed: yes     Risks discussed:  Infection, pain and poor cosmetic result   Alternatives discussed:  No treatment Universal protocol:    Procedure explained and questions answered to patient or proxy's satisfaction: yes     Relevant documents present and verified: yes     Test  results available: yes     Imaging studies available: yes     Required blood products, implants, devices, and special equipment available: yes     Site/side marked: yes     Immediately prior to procedure, a time out was called: yes     Patient identity confirmed:  Verbally with patient and arm band Anesthesia:    Anesthesia method:  Local infiltration   Local anesthetic:  Lidocaine 1% WITH epi Laceration details:    Location:  Lip   Lip location:  Lower lip, full thickness   Vermilion border involved: no     Height of lip laceration:  Up to half vertical height   Length (cm):  3.5   Depth (mm):  5 Pre-procedure details:    Preparation:  Patient was prepped and draped in usual sterile fashion and imaging obtained to evaluate for foreign bodies Exploration:    Limited defect created (wound extended): no     Hemostasis achieved with:  Direct pressure   Imaging obtained: x-ray     Imaging outcome: foreign body not noted     Wound exploration: entire depth of wound visualized     Wound extent: muscle damage     Wound extent: no foreign body     Contaminated: no   Treatment:    Area cleansed with:  Saline   Amount of cleaning:  Standard   Debridement:  None   Undermining:  None   Scar revision: no     Layers/structures repaired:  Muscle belly Muscle belly:    Suture size:  4-0  Suture material:  Chromic gut   Suture technique:  Simple interrupted   Number of sutures:  3 (also, mucosal layer closed with 4-0 chromic gut with 5 sutures placed) Skin repair:    Repair method:  Sutures   Suture size:  5-0   Suture material:  Prolene   Suture technique:  Simple interrupted   Number of sutures:  6 Approximation:    Approximation:  Close Repair type:    Repair type:  Intermediate (3-layer closure) Post-procedure details:    Dressing:  Open (no dressing)   Procedure completion:  Tolerated well, no immediate complications     Medications Ordered in ED Medications - No data to  display  ED Course/ Medical Decision Making/ A&P                             Medical Decision Making Amount and/or Complexity of Data Reviewed Radiology: ordered.  Risk Prescription drug management.   Fall with head injury with loss of consciousness and laceration of lower lip involving both mucosal and epidermal surfaces.  I have ordered an Tdap booster, CT of head, CT of cervical spine, CT of maxillofacial bones.  Laceration will need 3 layer closure.  CT scans show no acute injury.  I have independently viewed the images, and agree with radiologist's interpretation.  Laceration is sutured closed, initial dose of amoxicillin is ordered.  Clinically, with loss of consciousness, patient did have a concussion.  I have requested a trial of ambulation prior to discharge.  HE was able to ambulate with a steady gate, and is safe for discharge.  I am discharging him with a 3-day course of amoxicillin to prevent wound infection.  Final Clinical Impression(s) / ED Diagnoses Final diagnoses:  Fall down steps, initial encounter  Concussion with loss of consciousness of 30 minutes or less, initial encounter  Laceration of lower lip, initial encounter    Rx / DC Orders ED Discharge Orders     None         Delora Fuel, MD 31/54/00 [phone]

## 2022-03-11 NOTE — ED Triage Notes (Addendum)
  Patient comes in after a fall that occurred about an hour ago.  Patients wife states patient was going down stairs and fell, hitting his head on the wall and causing laceration on outside of bottom lip.  Patient unclear if he passed out or not.  After fall he crawled into the next room and wife found him bleeding.  Pain 1/10.   Doesn't take blood thinners.
